# Patient Record
Sex: Female | Born: 2000 | Race: Black or African American | Hispanic: No | Marital: Single | Smoking: Former smoker
Health system: Southern US, Community
[De-identification: ages and names within clinical notes are randomized; demographics above are authoritative.]

## PROBLEM LIST (undated history)

## (undated) DIAGNOSIS — B353 Tinea pedis: Secondary | ICD-10-CM

## (undated) DIAGNOSIS — J45909 Unspecified asthma, uncomplicated: Secondary | ICD-10-CM

## (undated) DIAGNOSIS — I10 Essential (primary) hypertension: Secondary | ICD-10-CM

## (undated) HISTORY — PX: CYST REMOVAL HAND: SHX6279

## (undated) HISTORY — PX: TONSILLECTOMY AND ADENOIDECTOMY: SUR1326

## (undated) HISTORY — DX: Unspecified asthma, uncomplicated: J45.909

---
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
## 2021-02-07 ENCOUNTER — Other Ambulatory Visit: Payer: Self-pay

## 2021-02-07 ENCOUNTER — Emergency Department
Admission: EM | Admit: 2021-02-07 | Discharge: 2021-02-07 | Disposition: A | Discharge status: Home / Self Care | Payer: Medicaid Other | Attending: Student in an Organized Health Care Education/Training Program | Admitting: Student in an Organized Health Care Education/Training Program

## 2021-02-07 ENCOUNTER — Encounter: Payer: Self-pay | Admitting: Emergency Medicine

## 2021-02-07 DIAGNOSIS — Z20822 Contact with and (suspected) exposure to covid-19: Secondary | ICD-10-CM | POA: Diagnosis not present

## 2021-02-07 DIAGNOSIS — R197 Diarrhea, unspecified: Secondary | ICD-10-CM | POA: Insufficient documentation

## 2021-02-07 DIAGNOSIS — M791 Myalgia, unspecified site: Secondary | ICD-10-CM | POA: Diagnosis not present

## 2021-02-07 DIAGNOSIS — I10 Essential (primary) hypertension: Secondary | ICD-10-CM | POA: Insufficient documentation

## 2021-02-07 DIAGNOSIS — J45909 Unspecified asthma, uncomplicated: Secondary | ICD-10-CM | POA: Insufficient documentation

## 2021-02-07 DIAGNOSIS — R519 Headache, unspecified: Secondary | ICD-10-CM | POA: Insufficient documentation

## 2021-02-07 DIAGNOSIS — R109 Unspecified abdominal pain: Secondary | ICD-10-CM | POA: Diagnosis not present

## 2021-02-07 NOTE — ED Notes (Signed)
Pt reports symptoms began 2 days ago. Pt reports headache, sore throat, generalized body aches, SOB with activity, diarrhea, some nausea. Pt reports hx of asthma, does report mild increase to symptoms. Has home rescue inhaler and maintenance inhaler. Has used rescue inhaler once today after growing SOB at work.

## 2021-02-07 NOTE — ED Provider Notes (Signed)
[facility] Emergency Department Provider Note  ____________________________________________   Event Date/Time   First MD Initiated Contact with Patient 02/07/21 1645     (approximate)  I have reviewed the triage vital signs and the nursing notes.   HISTORY  Chief Complaint Headache and Generalized Body Aches    HPI Pamela Marshall is a 20 y.o. female presents to the emergency department with URI symptoms for 2 days.   Is complaining of ?fever, chills, denies, cough or congestion, chest pain, shortness of breath , does complain of stomach cramping, headache, body aches and diarrhea.  Positive close contact with Covid19+ patient, patient is not vaccinated.   Past Medical History:  Diagnosis Date  . Asthma   . Hypertension     There are no problems to display for this patient.   Past Surgical History:  Procedure Laterality Date  . CYST REMOVAL HAND    . TONSILLECTOMY AND ADENOIDECTOMY      Prior to Admission medications   Medication Sig Start Date End Date Taking? Authorizing Provider  diclofenac sodium (VOLTAREN) 1 % GEL Apply 2 g topically 4 (four) times daily. Patient not taking: Reported on 08/12/2018 02/23/17   Enid Derry, PA-C    Allergies Parke Simmers allergy]  Family History  Problem Relation Age of Onset  . Diabetes Paternal Grandfather   . Diabetes Paternal Grandmother   . Diabetes Paternal Uncle     Social History Social History   Tobacco Use  . Smoking status: Never Smoker  . Smokeless tobacco: Never Used  Vaping Use  . Vaping Use: Never used  Substance Use Topics  . Alcohol use: Never  . Drug use: Never    Review of Systems  Constitutional: Positive fever/chills Eyes: No visual changes. ENT: Negative sore throat. Respiratory: Negative cough Cardiovascular: Negative chest pain Gastrointestinal: Complains of abdominal pain with diarrhea only Genitourinary: Negative for dysuria. Musculoskeletal: Negative  for back pain. Skin: Negative for rash. Neurological: Denies neurological changes    ____________________________________________   PHYSICAL EXAM:  VITAL SIGNS: ED Triage Vitals  Enc Vitals Group     BP 02/07/21 1603 121/84     Pulse Rate 02/07/21 1603 87     Resp 02/07/21 1603 16     Temp 02/07/21 1603 98.2 F (36.8 C)     Temp Source 02/07/21 1603 Oral     SpO2 02/07/21 1603 100 %     Weight 02/07/21 1604 245 lb (111.1 kg)     Height 02/07/21 1604 5\' 2"  (1.575 m)     Head Circumference --      Peak Flow --      Pain Score 02/07/21 1604 10     Pain Loc --      Pain Edu? --      Excl. in GC? --     Constitutional: Alert and oriented. Well appearing and in no acute distress. Eyes: Conjunctivae are normal.  Head: Atraumatic. Nose: No congestion/rhinnorhea. Mouth/Throat: Mucous membranes are moist.  Neck:  supple no lymphadenopathy noted Cardiovascular: Normal rate, regular rhythm. Heart sounds are normal Respiratory: Normal respiratory effort.  No retractions, lungs CTA Abdomen: Abdomen is nontender, bowel sounds normal all 4 quads GU: deferred Musculoskeletal: FROM all extremities, warm and well perfused Neurologic:  Normal speech and language.  Skin:  Skin is warm, dry and intact. No rash noted. Psychiatric: Mood and affect are normal. Speech and behavior are normal.  ____________________________________________   LABS (all labs ordered are listed, but only  abnormal results are displayed)  Labs Reviewed  SARS CORONAVIRUS 2 (TAT 6-24 HRS)   ____________________________________________   ____________________________________________  RADIOLOGY    ____________________________________________   PROCEDURES  Procedure(s) performed: No  Procedures    ____________________________________________   INITIAL IMPRESSION / ASSESSMENT AND PLAN / ED COURSE  Pertinent labs & imaging results that were available during my care of the patient were reviewed  by me and considered in my medical decision making (see chart for details).   Patient is a 20 year old female who complains of Covid-like symptoms.  Exam is unremarkable Pending test for covid   The patient was instructed to quarantine themselves at home.  Follow-up with your regular doctor if any concerns.  Return emergency department for worsening. OTC measures discussed     Pamela Marshall was evaluated in Emergency Department on 02/07/2021 for the symptoms described in the history of present illness. She was evaluated in the context of the global COVID-19 pandemic, which necessitated consideration that the patient might be at risk for infection with the SARS-CoV-2 virus that causes COVID-19. Institutional protocols and algorithms that pertain to the evaluation of patients at risk for COVID-19 are in a state of rapid change based on information released by regulatory bodies including the CDC and federal and state organizations. These policies and algorithms were followed during the patient's care in the ED.   As part of my medical decision making, I reviewed the following data within the electronic MEDICAL RECORD NUMBER Nursing notes reviewed and incorporated, Old chart reviewed, Notes from prior ED visits and [place] Controlled Substance Database  ____________________________________________   FINAL CLINICAL IMPRESSION(S) / ED DIAGNOSES  Final diagnoses:  Suspected COVID-19 virus infection      NEW MEDICATIONS STARTED DURING THIS VISIT:  Current Discharge Medication List       Note:  This document was prepared using Dragon voice recognition software and may include unintentional dictation errors.    Faythe Ghee, PA-C 02/07/21 1713    Merwyn Katos, MD 02/07/21 2011

## 2021-02-07 NOTE — ED Triage Notes (Signed)
Pt in via POV, reports "Covid symptoms" x 2 days, being exposed recently at work.  Complaints of headache, body aches, diarrhea.  Vitals WDL.  Ambulatory to triage, NAD noted at this time.

## 2021-02-07 NOTE — Discharge Instructions (Addendum)
Your Covid test should result in 6 to 24 hours.  Please sign up for [facility] My[facility] with the access code on your discharge papers.  You will see the test result as soon as it is finished.  You may not get a phone call due to the volume of Covid test.  Please sign up for this so you will know your results  Take over-the-counter vitamin C, vitamin D, and zinc to help boost your immune system Take over-the-counter Mucinex for cough as needed Take over-the-counter Tylenol and ibuprofen for fever and body aches Follow-up with your regular doctor as needed Return emergency department worsening Quarantine for 5 days if you are vaccinated and 7-10 days if you are not.  Pamela Marshall is from onset of symptoms.  At that time you may return to school/work if your symptoms have improved and you have been fever free for 24 hours.

## 2021-02-08 LAB — SARS CORONAVIRUS 2 (TAT 6-24 HRS): SARS Coronavirus 2: NEGATIVE

## 2021-04-15 ENCOUNTER — Other Ambulatory Visit: Payer: Self-pay

## 2021-04-15 ENCOUNTER — Ambulatory Visit (LOCAL_COMMUNITY_HEALTH_CENTER): Payer: Medicaid Other

## 2021-04-15 DIAGNOSIS — Z111 Encounter for screening for respiratory tuberculosis: Secondary | ICD-10-CM

## 2021-04-18 ENCOUNTER — Ambulatory Visit (LOCAL_COMMUNITY_HEALTH_CENTER): Payer: Medicaid Other

## 2021-04-18 ENCOUNTER — Other Ambulatory Visit: Payer: Self-pay

## 2021-04-18 DIAGNOSIS — Z111 Encounter for screening for respiratory tuberculosis: Secondary | ICD-10-CM

## 2021-04-18 LAB — TB SKIN TEST
Induration: 0 mm
TB Skin Test: NEGATIVE

## 2021-05-08 ENCOUNTER — Ambulatory Visit: Payer: Medicaid Other | Admitting: Obstetrics and Gynecology

## 2021-05-15 ENCOUNTER — Ambulatory Visit: Payer: Medicaid Other | Admitting: Obstetrics and Gynecology

## 2021-05-22 ENCOUNTER — Telehealth: Payer: Self-pay | Admitting: Family Medicine

## 2021-05-22 NOTE — Telephone Encounter (Signed)
Phone call to pt. Pt states someone had already called her back and she has already scheduled another appt. No further assistance is needed at this time.

## 2021-05-22 NOTE — Telephone Encounter (Signed)
Patient needed to have a 2nd Step TB Skin Test, but her 1st one was completed on 4/21. She is calling to see how to proceed. Does she need to make it all over again, or would she be still on time for that 2nd Step? Please call her back. Thanks

## 2021-05-28 ENCOUNTER — Other Ambulatory Visit: Payer: Self-pay

## 2021-05-31 ENCOUNTER — Ambulatory Visit (LOCAL_COMMUNITY_HEALTH_CENTER): Payer: Medicaid Other

## 2021-05-31 ENCOUNTER — Other Ambulatory Visit: Payer: Self-pay

## 2021-05-31 DIAGNOSIS — Z111 Encounter for screening for respiratory tuberculosis: Secondary | ICD-10-CM

## 2021-06-03 ENCOUNTER — Ambulatory Visit (LOCAL_COMMUNITY_HEALTH_CENTER): Payer: Medicaid Other

## 2021-06-03 ENCOUNTER — Other Ambulatory Visit: Payer: Self-pay

## 2021-06-03 DIAGNOSIS — Z111 Encounter for screening for respiratory tuberculosis: Secondary | ICD-10-CM

## 2021-06-03 LAB — TB SKIN TEST
Induration: 0 mm
TB Skin Test: NEGATIVE

## 2021-06-10 ENCOUNTER — Encounter: Payer: Self-pay | Admitting: Obstetrics and Gynecology

## 2021-06-10 ENCOUNTER — Ambulatory Visit (INDEPENDENT_AMBULATORY_CARE_PROVIDER_SITE_OTHER): Payer: Medicaid Other | Admitting: Obstetrics and Gynecology

## 2021-06-10 ENCOUNTER — Other Ambulatory Visit: Payer: Self-pay

## 2021-06-10 ENCOUNTER — Other Ambulatory Visit (HOSPITAL_COMMUNITY)
Admission: RE | Admit: 2021-06-10 | Discharge: 2021-06-10 | Disposition: A | Discharge status: Home / Self Care | Payer: Medicaid Other | Source: Ambulatory Visit | Attending: Obstetrics and Gynecology | Admitting: Obstetrics and Gynecology

## 2021-06-10 VITALS — BP 100/80 | Ht 62.0 in | Wt 240.0 lb

## 2021-06-10 DIAGNOSIS — Z113 Encounter for screening for infections with a predominantly sexual mode of transmission: Secondary | ICD-10-CM | POA: Diagnosis present

## 2021-06-10 DIAGNOSIS — Z3046 Encounter for surveillance of implantable subdermal contraceptive: Secondary | ICD-10-CM | POA: Diagnosis not present

## 2021-06-10 MED ORDER — NEXPLANON 68 MG ~~LOC~~ IMPL: 1.0000 | DRUG_IMPLANT | Freq: Once | SUBCUTANEOUS | 0 refills | Status: DC

## 2021-06-10 NOTE — Progress Notes (Signed)
Obstetrics & Gynecology Office Visit   Chief Complaint  Patient presents with   Contraception    Nexplanon removal, would like another nexplanon   STD testing   Menorrhagia    For the last 3 months, severe cramping    History of Present Illness: 20 y.o. G0P0000 female who presents for removal of her Nexplanon.  She has been having frequent periods. This has been going on for 3 months. This is getting to the point where she has cramps and stomach pains. Nothing else has changed.  She had her current Nexplanon since 07/2018.  She would like STI screening and replacement of her Nexplanon to see if that helps with the bleeding.     Past Medical History:  Diagnosis Date   Asthma    Hypertension     Past Surgical History:  Procedure Laterality Date   CYST REMOVAL HAND     TONSILLECTOMY AND ADENOIDECTOMY      Gynecologic History: Patient's last menstrual period was 06/07/2021 (exact date).  Obstetric History: G0P0000  Family History  Problem Relation Age of Onset   Diabetes Paternal Grandfather    Diabetes Paternal Grandmother    Diabetes Paternal Uncle     Social History   Socioeconomic History   Marital status: Single    Spouse name: Not on file   Number of children: Not on file   Years of education: Not on file   Highest education level: Not on file  Occupational History   Not on file  Tobacco Use   Smoking status: Never   Smokeless tobacco: Never  Vaping Use   Vaping Use: Never used  Substance and Sexual Activity   Alcohol use: Never   Drug use: Never   Sexual activity: Yes    Birth control/protection: Implant  Other Topics Concern   Not on file  Social History Narrative   Not on file   Social Determinants of Health   Financial Resource Strain: Not on file  Food Insecurity: Not on file  Transportation Needs: Not on file  Physical Activity: Not on file  Stress: Not on file  Social Connections: Not on file  Intimate Partner Violence: Not on file     Allergies  Allergen Reactions   Crab [Shellfish Allergy] Other (See Comments)    Throat soreness     Prior to Admission medications   Albuterol inhaler prn asthma.     Review of Systems  Constitutional: Negative.   HENT: Negative.    Eyes: Negative.   Respiratory: Negative.    Cardiovascular: Negative.   Gastrointestinal: Negative.   Genitourinary: Negative.   Musculoskeletal: Negative.   Skin: Negative.   Neurological: Negative.   Psychiatric/Behavioral: Negative.      Physical Exam BP 100/80   Ht 5\' 2"  (1.575 m)   Wt 240 lb (108.9 kg)   LMP 06/07/2021 (Exact Date)   BMI 43.90 kg/m  Patient's last menstrual period was 06/07/2021 (exact date). Physical Exam Constitutional:      General: She is not in acute distress.    Appearance: Normal appearance.  Genitourinary:     Bladder and urethral meatus normal.     No lesions in the vagina.     Right Labia: No rash, tenderness, lesions or skin changes.    Left Labia: No tenderness, lesions, skin changes or rash.    No inguinal adenopathy present in the right or left side.    Pelvic Tanner Score: 5/5.    No vaginal discharge, erythema, bleeding or  ulceration.     No vaginal prolapse present.     Right Adnexa: not tender, not full and no mass present.    Left Adnexa: not tender, not full and no mass present.    No cervical motion tenderness, discharge, friability, lesion or polyp.     Uterus is not enlarged, fixed or tender.     Uterus is anteverted.  HENT:     Head: Normocephalic and atraumatic.  Eyes:     General: No scleral icterus.    Conjunctiva/sclera: Conjunctivae normal.  Lymphadenopathy:     Lower Body: No right inguinal adenopathy. No left inguinal adenopathy.  Neurological:     General: No focal deficit present.     Mental Status: She is alert and oriented to person, place, and time.     Cranial Nerves: No cranial nerve deficit.  Psychiatric:        Mood and Affect: Mood normal.        Behavior:  Behavior normal.        Judgment: Judgment normal.     GYNECOLOGY PROCEDURE NOTE  Nexplanon removal discussed in detail.  Risks of infection, bleeding, nerve injury all reviewed.  Patient understands risks and desires to proceed.  Verbal consent obtained.  Patient is certain she wants the Nexplanon removed.  All questions answered.  Procedure: Patient placed in dorsal supine with left arm above head, elbow flexed at 90 degrees, arm resting on examination table.  Nexplanon identified without problems.  Betadine scrub x3.  1 ml of 1% lidocaine injected under Nexplanondevice without problems.  Sterile gloves applied.  Small 0.5cm incision made at distal tip of Nexplanon device with 11 blade scalpel.  Nexplanon brought to incision and grasped with a small kelly clamp.  Nexplanon removed intact without problems.  Pressure applied to incision.  Hemostasis obtained.     GYNECOLOGY PROCEDURE NOTE  Patient is a 20 y.o. G0P0000 presenting for Nexplanon insertion as her desires means of contraception.  She provided informed consent, signed copy in the chart, time out was performed. Pregnancy test was not done given her use of Nexplanon that is still in date, with self reported LMP of Patient's last menstrual period was 06/07/2021 (exact date).  She understands that Nexplanon is a progesterone only therapy, and that patients often patients have irregular and unpredictable vaginal bleeding or amenorrhea. She understands that other side effects are possible related to systemic progesterone, including but not limited to, headaches, breast tenderness, nausea, and irritability. While effective at preventing pregnancy long acting reversible contraceptives do not prevent transmission of sexually transmitted diseases and use of barrier methods for this purpose was discussed. The placement procedure for Nexplanon was reviewed with the patient in detail including risks of nerve injury, infection, bleeding and injury to  other muscles or tendons. She understands that the Nexplanon implant is good for 3 years and needs to be removed at the end of that time.  She understands that Nexplanon is an extremely effective option for contraception, with failure rate of <1%. This information is reviewed today and all questions were answered. Informed consent was obtained, both verbally and written.   The patient is healthy and has no contraindications to Nexplanon use. Urine pregnancy test was performed today and was negative.  Procedure Appropriate time out taken.  Patient placed in dorsal supine with left arm above head, elbow flexed at 90 degrees, arm resting on examination table with hand behind her head.  The bicipital grove was palpated and site 8-10cm  proximal to the medial epicondyle was indentified.  Per the manufacturer's recommendations, the insertion site was marked along a line 3-5 cm posterior (toward the triceps) to the bicipital groove and at 8-10 cm medial to the medial epicondyle. The insertion site was prepped with a two betadine swabs and then injected with 3 cc of 1% lidocaine without epinephrine.  Nexplanon removed form sterile blister packaging,  Device confirmed in needle, before inserting full length of needle, tenting up the skin as the needle was advance.  The drug eluting rod was then deployed by pulling back the slider per the manufactures recommendation.  The implant was palpable by the clinician as well as the patient.  The insertion site covered dressed with a 1/2" steri-strip before applying  a kerlex bandage pressure dressing..Minimal blood loss was noted during the procedure.  The patient tolerated the procedure well.   She was instructed to wear the bandage for 24 hours, call with any signs of infection.  She was given the Nexplanon card and instructed to have the rod removed in 3 years.  Female chaperone present for pelvic and breast  portions of the physical exam  Assessment: 20 y.o. G0P0000  female here for  1. Encounter for removal and reinsertion of Nexplanon   2. Screen for STD (sexually transmitted disease)      Plan: Problem List Items Addressed This Visit   None Visit Diagnoses     Encounter for removal and reinsertion of Nexplanon    -  Primary   Relevant Medications   etonogestrel (NEXPLANON) 68 MG IMPL implant   Screen for STD (sexually transmitted disease)       Relevant Orders   Cervicovaginal ancillary only      Successful Nexplanon removal and reinsertion.   STI screening obtained. If no relief of sx in 2 months, return to clinic for further assessment.  Thomasene Mohair, MD 06/10/2021 11:07 AM

## 2021-06-11 LAB — CERVICOVAGINAL ANCILLARY ONLY
Chlamydia: POSITIVE — AB
Comment: NEGATIVE
Comment: NEGATIVE
Comment: NORMAL
Neisseria Gonorrhea: POSITIVE — AB
Trichomonas: NEGATIVE

## 2021-06-11 NOTE — Telephone Encounter (Signed)
Pt called as well about positive results.  [phone]

## 2021-06-12 ENCOUNTER — Other Ambulatory Visit: Payer: Self-pay | Admitting: Obstetrics and Gynecology

## 2021-06-12 DIAGNOSIS — A749 Chlamydial infection, unspecified: Secondary | ICD-10-CM

## 2021-06-12 DIAGNOSIS — A549 Gonococcal infection, unspecified: Secondary | ICD-10-CM

## 2021-06-12 MED ORDER — DOXYCYCLINE HYCLATE 100 MG PO CAPS: 100.0000 mg | ORAL_CAPSULE | Freq: Two times a day (BID) | ORAL | 0 refills | Status: AC

## 2021-06-12 MED ORDER — CEFTRIAXONE SODIUM 500 MG IJ SOLR: 500.0000 mg | Freq: Once | INTRAMUSCULAR | Status: DC

## 2021-06-13 ENCOUNTER — Other Ambulatory Visit: Payer: Self-pay

## 2021-06-13 ENCOUNTER — Ambulatory Visit (INDEPENDENT_AMBULATORY_CARE_PROVIDER_SITE_OTHER): Payer: Medicaid Other

## 2021-06-13 DIAGNOSIS — A549 Gonococcal infection, unspecified: Secondary | ICD-10-CM | POA: Diagnosis not present

## 2021-06-13 MED ORDER — CEFTRIAXONE SODIUM 500 MG IJ SOLR
500.0000 mg | Freq: Once | INTRAMUSCULAR | Status: AC
  Administered 2021-06-13: 500 mg via INTRAMUSCULAR

## 2021-06-13 NOTE — Progress Notes (Signed)
Patient presents today for Ceftriaxone injection. Given IM LUOQ. Patient tolerated well.

## 2021-09-09 ENCOUNTER — Other Ambulatory Visit: Payer: Self-pay

## 2021-09-09 ENCOUNTER — Ambulatory Visit: Payer: Medicaid Other | Admitting: Obstetrics and Gynecology

## 2021-09-09 ENCOUNTER — Encounter: Payer: Self-pay | Admitting: Obstetrics and Gynecology

## 2021-09-09 ENCOUNTER — Ambulatory Visit (INDEPENDENT_AMBULATORY_CARE_PROVIDER_SITE_OTHER): Payer: Medicaid Other | Admitting: Obstetrics and Gynecology

## 2021-09-09 ENCOUNTER — Other Ambulatory Visit (HOSPITAL_COMMUNITY)
Admission: RE | Admit: 2021-09-09 | Discharge: 2021-09-09 | Disposition: A | Discharge status: Home / Self Care | Payer: Medicaid Other | Source: Ambulatory Visit | Attending: Obstetrics and Gynecology | Admitting: Obstetrics and Gynecology

## 2021-09-09 VITALS — BP 100/70 | Ht 62.0 in | Wt 244.0 lb

## 2021-09-09 DIAGNOSIS — Z113 Encounter for screening for infections with a predominantly sexual mode of transmission: Secondary | ICD-10-CM

## 2021-09-09 DIAGNOSIS — N926 Irregular menstruation, unspecified: Secondary | ICD-10-CM | POA: Diagnosis not present

## 2021-09-09 DIAGNOSIS — A749 Chlamydial infection, unspecified: Secondary | ICD-10-CM

## 2021-09-09 DIAGNOSIS — A549 Gonococcal infection, unspecified: Secondary | ICD-10-CM | POA: Diagnosis present

## 2021-09-09 DIAGNOSIS — Z3046 Encounter for surveillance of implantable subdermal contraceptive: Secondary | ICD-10-CM

## 2021-09-09 NOTE — Progress Notes (Signed)
[facility]   Chief Complaint  Patient presents with   STD testing   Follow-up    Tingling sensation/pain in nexplanon location since insertion    HPI:      [name] is a 20 y.o. G0P0000 whose LMP was No LMP recorded. Patient has had an implant., presents today for STD test of cure. Pos gon and chlamydia on culture 06/10/21; treated with rocephin and doxy BID for 7 days. Partner did tx and they were sexually active afterwards, but he was still talking to another female. Pt unsure if she still has it or not. No vag sx, no pelvic pain. Not sex active now.  Nexplanon replaced by Dr. Jean Marshall 06/10/21. Has had daily bleeding with clots the past 2 months, irreg bleeding prior to and after replacement. Area of nexplanon rod with some tingling and numbness in upper arm, occas pain. No evid of infection.    Past Medical History:  Diagnosis Date   Asthma    Hypertension     Past Surgical History:  Procedure Laterality Date   CYST REMOVAL HAND     TONSILLECTOMY AND ADENOIDECTOMY      Family History  Problem Relation Age of Onset   Diabetes Paternal Grandfather    Diabetes Paternal Grandmother    Diabetes Paternal Uncle     Social History   Socioeconomic History   Marital status: Single    Spouse name: Not on file   Number of children: Not on file   Years of education: Not on file   Highest education level: Not on file  Occupational History   Not on file  Tobacco Use   Smoking status: Never   Smokeless tobacco: Never  Vaping Use   Vaping Use: Never used  Substance and Sexual Activity   Alcohol use: Never   Drug use: Never   Sexual activity: Yes    Birth control/protection: Implant  Other Topics Concern   Not on file  Social History Narrative   Not on file   Social Determinants of Health   Financial Resource Strain: Not on file  Food Insecurity: Not on file  Transportation Needs: Not on file  Physical Activity: Not on file  Stress:  Not on file  Social Connections: Not on file  Intimate Partner Violence: Not on file    Outpatient Medications Prior to Visit  Medication Sig Dispense Refill   etonogestrel (NEXPLANON) 68 MG IMPL implant 1 each (68 mg total) by Subdermal route once for 1 dose. 1 each 0   Facility-Administered Medications Prior to Visit  Medication Dose Route Frequency Provider Last Rate Last Admin   cefTRIAXone (ROCEPHIN) injection 500 mg  500 mg Intramuscular Once Pamela Novak, MD       triamcinolone acetonide (KENALOG) 10 MG/ML injection 10 mg  10 mg Other Once Pamela Marshall, DPM          ROS:  Review of Systems  Constitutional:  Negative for fever.  Gastrointestinal:  Negative for blood in stool, constipation, diarrhea, nausea and vomiting.  Genitourinary:  Positive for vaginal bleeding. Negative for dyspareunia, dysuria, flank pain, frequency, hematuria, urgency, vaginal discharge and vaginal pain.  Musculoskeletal:  Negative for back pain.  Skin:  Negative for rash.  BREAST: No symptoms   OBJECTIVE:   Vitals:  BP 100/70   Ht 5\' 2"  (1.575 m)   Wt 244 lb (110.7 kg)   BMI 44.63 kg/m   Physical Exam Vitals reviewed.  Constitutional:  Appearance: She is well-developed.  Pulmonary:     Effort: Pulmonary effort is normal.  Genitourinary:    General: Normal vulva.     Pubic Area: No rash.      Labia:        Right: No rash, tenderness or lesion.        Left: No rash, tenderness or lesion.      Vagina: Normal. No vaginal discharge, erythema or tenderness.     Cervix: Normal.     Uterus: Normal. Not enlarged and not tender.      Adnexa: Right adnexa normal and left adnexa normal.       Right: No mass or tenderness.         Left: No mass or tenderness.    Musculoskeletal:        General: Normal range of motion.     Cervical back: Normal range of motion.  Skin:    General: Skin is warm and dry.     Findings: No abscess, bruising or signs of injury.     Comments: LT ARM  WITH NEXPLANON; NEG EXAM  Neurological:     General: No focal deficit present.     Mental Status: She is alert and oriented to person, place, and time.  Psychiatric:        Mood and Affect: Mood normal.        Behavior: Behavior normal.        Thought Content: Thought content normal.        Judgment: Judgment normal.    Assessment/Plan: Gonorrhea - Plan: Cervicovaginal ancillary only  Chlamydia - Plan: Cervicovaginal ancillary only  Screening for STD (sexually transmitted disease) - Plan: Cervicovaginal ancillary only; test of cure today. Will f/u with results.   Irregular bleeding--with nexplanon. Rule out STDs. If neg, will do POPs to stop bleeding.   Encounter for surveillance of implantable subdermal contraceptive--rod in correct location; may be on nerve causing sx, but not worrisome. Reassurance. Can always remove and replace prn.     Return if symptoms worsen or fail to improve.  Pamela Marshall B. Pamela Rahn, PA-C 09/09/2021 4:38 PM

## 2021-09-11 ENCOUNTER — Telehealth: Payer: Self-pay | Admitting: Obstetrics and Gynecology

## 2021-09-11 DIAGNOSIS — A749 Chlamydial infection, unspecified: Secondary | ICD-10-CM

## 2021-09-11 DIAGNOSIS — A549 Gonococcal infection, unspecified: Secondary | ICD-10-CM

## 2021-09-11 LAB — CERVICOVAGINAL ANCILLARY ONLY
Chlamydia: POSITIVE — AB
Comment: NEGATIVE
Comment: NEGATIVE
Comment: NORMAL
Neisseria Gonorrhea: POSITIVE — AB
Trichomonas: NEGATIVE

## 2021-09-11 MED ORDER — AZITHROMYCIN 500 MG PO TABS: 1000.0000 mg | ORAL_TABLET | Freq: Once | ORAL | 0 refills | Status: AC

## 2021-09-11 MED ORDER — CEFTRIAXONE SODIUM 500 MG IJ SOLR: 500.0000 mg | Freq: Once | INTRAMUSCULAR | Status: DC

## 2021-09-11 NOTE — Telephone Encounter (Signed)
Pt aware of pos gon/chlam. Rx azithro eRxd, RTO tomorrow with RN for rocephin. RTO in 4 wks for TOC. Partner needs tx, no sex activity for 1 wk after both treated. Use condoms. CMA to notify [facility].  Pt with BTB on nexplanon. Treated STDs first. Will do POPs if sx persist after neg TOC.

## 2021-09-12 ENCOUNTER — Ambulatory Visit: Payer: Medicaid Other

## 2021-09-13 ENCOUNTER — Ambulatory Visit (INDEPENDENT_AMBULATORY_CARE_PROVIDER_SITE_OTHER): Payer: Medicaid Other

## 2021-09-13 ENCOUNTER — Other Ambulatory Visit: Payer: Self-pay

## 2021-09-13 DIAGNOSIS — A749 Chlamydial infection, unspecified: Secondary | ICD-10-CM | POA: Diagnosis not present

## 2021-09-13 DIAGNOSIS — A549 Gonococcal infection, unspecified: Secondary | ICD-10-CM | POA: Diagnosis not present

## 2021-09-13 MED ORDER — LIDOCAINE HCL (PF) 1 % IJ SOLN
0.9000 mL | Freq: Once | INTRAMUSCULAR | Status: AC
  Administered 2021-09-13: 0.9 mL

## 2021-09-13 MED ORDER — CEFTRIAXONE SODIUM 500 MG IJ SOLR
500.0000 mg | Freq: Once | INTRAMUSCULAR | Status: AC
  Administered 2021-09-13: 500 mg via INTRAMUSCULAR

## 2021-09-13 NOTE — Progress Notes (Addendum)
Pt here for inj of ceftriaxone 500mg  which was given IM right dorsoglut after reconstituting with 1% lidocaine 0.72ml.  NDC# [phone]  Pt tolerated inj well.

## 2021-09-19 NOTE — Telephone Encounter (Signed)
[facility] notified. 

## 2021-12-16 NOTE — Progress Notes (Signed)
[facility]   Chief Complaint  Patient presents with   STD testing    HPI:      [name] Marshall a 20 y.o. G0P0000 whose LMP was Patient's last menstrual period was 11/11/2021 (approximate)., presents today for test of cure for Gon/chlam 9/22; treated with azithro and rocephin. Pt states partner said he did tx but Pamela doesn't think he did. No longer with him now but afraid Pamela still has STDs. Had Pos gon and chlamydia on culture 06/10/21; treated with rocephin and doxy BID for 7 days without neg TOC 9/22. Pt now with increased vag d/c with odor, mild itching for a couple wk. No pelvic pain/LBP, fevers.  Not sex active currently, wants to get neg STD results first.   Patient Active Problem List   Diagnosis Date Noted   Gonorrhea 09/09/2021   Chlamydia 09/09/2021    Past Surgical History:  Procedure Laterality Date   CYST REMOVAL HAND     TONSILLECTOMY AND ADENOIDECTOMY      Family History  Problem Relation Age of Onset   Diabetes Paternal Grandfather    Diabetes Paternal Grandmother    Diabetes Paternal Uncle     Social History   Socioeconomic History   Marital status: Single    Spouse name: Not on file   Number of children: Not on file   Years of education: Not on file   Highest education level: Not on file  Occupational History   Not on file  Tobacco Use   Smoking status: Never   Smokeless tobacco: Never  Vaping Use   Vaping Use: Never used  Substance and Sexual Activity   Alcohol use: Never   Drug use: Never   Sexual activity: Not Currently    Birth control/protection: Implant  Other Topics Concern   Not on file  Social History Narrative   Not on file   Social Determinants of Health   Financial Resource Strain: Not on file  Food Insecurity: Not on file  Transportation Needs: Not on file  Physical Activity: Not on file  Stress: Not on file  Social Connections: Not on file  Intimate Partner Violence: Not on file     Outpatient Medications Prior to Visit  Medication Sig Dispense Refill   etonogestrel (NEXPLANON) 68 MG IMPL implant 1 each (68 mg total) by Subdermal route once for 1 dose. 1 each 0   Facility-Administered Medications Prior to Visit  Medication Dose Route Frequency Provider Last Rate Last Admin   cefTRIAXone (ROCEPHIN) injection 500 mg  500 mg Intramuscular Once Conard Novak, MD       cefTRIAXone (ROCEPHIN) injection 500 mg  500 mg Intramuscular Once Consuela Widener B, PA-C       triamcinolone acetonide (KENALOG) 10 MG/ML injection 10 mg  10 mg Other Once Felecia Shelling, DPM          ROS:  Review of Systems  Constitutional:  Negative for fever.  Gastrointestinal:  Negative for blood in stool, constipation, diarrhea, nausea and vomiting.  Genitourinary:  Positive for vaginal discharge. Negative for dyspareunia, dysuria, flank pain, frequency, hematuria, urgency, vaginal bleeding and vaginal pain.  Musculoskeletal:  Negative for back pain.  Skin:  Negative for rash.  BREAST: No symptoms   OBJECTIVE:   Vitals:  BP 100/70    Ht 5\' 2"  (1.575 m)    Wt 237 lb (107.5 kg)    LMP 11/11/2021 (Approximate)    BMI 43.35 kg/m  Physical Exam Vitals reviewed.  Constitutional:      Appearance: Pamela Marshall well-developed.  Pulmonary:     Effort: Pulmonary effort Marshall normal.  Genitourinary:    General: Normal vulva.     Pubic Area: No rash.      Labia:        Right: No rash, tenderness or lesion.        Left: No rash, tenderness or lesion.      Vagina: Vaginal discharge present. No erythema or tenderness.     Cervix: Normal.     Uterus: Normal. Not enlarged and not tender.      Adnexa: Right adnexa normal and left adnexa normal.       Right: No mass or tenderness.         Left: No mass or tenderness.    Musculoskeletal:        General: Normal range of motion.     Cervical back: Normal range of motion.  Skin:    General: Skin Marshall warm and dry.  Neurological:     General: No  focal deficit present.     Mental Status: Pamela Marshall alert and oriented to person, place, and time.  Psychiatric:        Mood and Affect: Mood normal.        Behavior: Behavior normal.        Thought Content: Thought content normal.        Judgment: Judgment normal.    Results: Results for orders placed or performed in visit on 12/17/21 (from the past 24 hour(s))  POCT Wet Prep with KOH     Status: Normal   Collection Time: 12/17/21  9:05 AM  Result Value Ref Range   Trichomonas, UA Negative    Clue Cells Wet Prep HPF POC neg    Epithelial Wet Prep HPF POC     Yeast Wet Prep HPF POC neg    Bacteria Wet Prep HPF POC     RBC Wet Prep HPF POC     WBC Wet Prep HPF POC     KOH Prep POC Negative Negative     Assessment/Plan: Gonorrhea - Plan: Cervicovaginal ancillary only,   Chlamydia - Plan: Cervicovaginal ancillary only,   Screening for STD (sexually transmitted disease) - Plan: Cervicovaginal ancillary only, test of cure today. Will f/u if pos.   Vaginal discharge - Plan: Cervicovaginal ancillary only, POCT Wet Prep with KOH; neg wet prep. Chck STDs and BV on culture. Will f/u if pos and treat.     Return if symptoms worsen or fail to improve.  Franceska Strahm B. Shali Vesey, PA-C 12/17/2021 9:06 AM

## 2021-12-17 ENCOUNTER — Other Ambulatory Visit (HOSPITAL_COMMUNITY)
Admission: RE | Admit: 2021-12-17 | Discharge: 2021-12-17 | Disposition: A | Discharge status: Home / Self Care | Payer: Medicaid Other | Source: Ambulatory Visit | Attending: Obstetrics and Gynecology | Admitting: Obstetrics and Gynecology

## 2021-12-17 ENCOUNTER — Other Ambulatory Visit: Payer: Self-pay

## 2021-12-17 ENCOUNTER — Ambulatory Visit (INDEPENDENT_AMBULATORY_CARE_PROVIDER_SITE_OTHER): Payer: Medicaid Other | Admitting: Obstetrics and Gynecology

## 2021-12-17 ENCOUNTER — Encounter: Payer: Self-pay | Admitting: Obstetrics and Gynecology

## 2021-12-17 VITALS — BP 100/70 | Ht 62.0 in | Wt 237.0 lb

## 2021-12-17 DIAGNOSIS — Z113 Encounter for screening for infections with a predominantly sexual mode of transmission: Secondary | ICD-10-CM | POA: Diagnosis present

## 2021-12-17 DIAGNOSIS — A749 Chlamydial infection, unspecified: Secondary | ICD-10-CM | POA: Diagnosis present

## 2021-12-17 DIAGNOSIS — N898 Other specified noninflammatory disorders of vagina: Secondary | ICD-10-CM

## 2021-12-17 DIAGNOSIS — A549 Gonococcal infection, unspecified: Secondary | ICD-10-CM | POA: Diagnosis not present

## 2021-12-17 LAB — POCT WET PREP WITH KOH
Clue Cells Wet Prep HPF POC: NEGATIVE
KOH Prep POC: NEGATIVE
Trichomonas, UA: NEGATIVE
Yeast Wet Prep HPF POC: NEGATIVE

## 2021-12-17 NOTE — Patient Instructions (Signed)
I value your feedback and you entrusting us with your care. If you get a [facility] patient survey, I would appreciate you taking the time to let us know about your experience today. Thank you! ? ? ?

## 2021-12-18 ENCOUNTER — Telehealth: Payer: Self-pay | Admitting: Obstetrics and Gynecology

## 2021-12-18 ENCOUNTER — Ambulatory Visit (INDEPENDENT_AMBULATORY_CARE_PROVIDER_SITE_OTHER): Payer: Medicaid Other

## 2021-12-18 DIAGNOSIS — A749 Chlamydial infection, unspecified: Secondary | ICD-10-CM

## 2021-12-18 DIAGNOSIS — N760 Acute vaginitis: Secondary | ICD-10-CM

## 2021-12-18 DIAGNOSIS — A549 Gonococcal infection, unspecified: Secondary | ICD-10-CM | POA: Diagnosis not present

## 2021-12-18 LAB — CERVICOVAGINAL ANCILLARY ONLY
Bacterial Vaginitis (gardnerella): POSITIVE — AB
Chlamydia: POSITIVE — AB
Comment: NEGATIVE
Comment: NEGATIVE
Comment: NEGATIVE
Comment: NORMAL
Neisseria Gonorrhea: POSITIVE — AB
Trichomonas: NEGATIVE

## 2021-12-18 MED ORDER — METRONIDAZOLE 500 MG PO TABS: ORAL_TABLET | ORAL | 0 refills | Status: DC

## 2021-12-18 MED ORDER — AZITHROMYCIN 500 MG PO TABS: 1000.0000 mg | ORAL_TABLET | Freq: Once | ORAL | 0 refills | Status: AC

## 2021-12-18 MED ORDER — CEFTRIAXONE SODIUM 500 MG IJ SOLR
500.0000 mg | Freq: Once | INTRAMUSCULAR | Status: AC
  Administered 2021-12-18: 16:00:00 500 mg via INTRAMUSCULAR

## 2021-12-18 MED ORDER — CEFTRIAXONE SODIUM 500 MG IJ SOLR: 500.0000 mg | Freq: Once | INTRAMUSCULAR | Status: DC

## 2021-12-18 NOTE — Telephone Encounter (Signed)
Pt aware of pos gon/chlam/BV on culture. Rx azithro and flagyl, will RTO for rocephin today. RTO in 4 wks TOC. Pt not sex active currently. CMA to notify [facility].   Meds ordered this encounter  Medications   azithromycin (ZITHROMAX) 500 MG tablet    Sig: Take 2 tablets (1,000 mg total) by mouth once for 1 dose.    Dispense:  2 tablet    Refill:  0    Order Specific Question:   Supervising Provider    Answer:   Nadara Mustard [[id number]]   metroNIDAZOLE (FLAGYL) 500 MG tablet    Sig: Take 1 tab BID for 7 days; NO alcohol use for 10 days after prescription start    Dispense:  14 tablet    Refill:  0    Order Specific Question:   Supervising Provider    Answer:   Nadara Mustard [[id number]]   cefTRIAXone (ROCEPHIN) injection 500 mg

## 2021-12-18 NOTE — Progress Notes (Signed)
Pt here for rocephin 500mg  inj which was given IM right glut.  Pt tolerated well.  [phone].  It was reconstituted c 1% xylocaine 0.59ml  NDC# 10m  exp date 04/2025  05/2025

## 2021-12-19 NOTE — Telephone Encounter (Signed)
[facility] notified. 

## 2022-01-14 NOTE — Progress Notes (Deleted)
° ° °  [facility]   No chief complaint on file.   HPI:      [name] is a 21 y.o. G0P0000 whose LMP was No LMP recorded. Patient has had an implant., presents today for *** Treated with rocephin 500 mg and azithro 1 g  12/17/21 NOTE:  test of cure for Gon/chlam 9/22; treated with azithro and rocephin. Pt states partner said he did tx but she doesn't think he did. No longer with him now but afraid she still has STDs. Had Pos gon and chlamydia on culture 06/10/21; treated with rocephin and doxy BID for 7 days without neg TOC 9/22. Pt now with increased vag d/c with odor, mild itching for a couple wk. No pelvic pain/LBP, fevers.  Not sex active currently, wants to get neg STD results first.   Patient Active Problem List   Diagnosis Date Noted   Gonorrhea 09/09/2021   Chlamydia 09/09/2021    Past Surgical History:  Procedure Laterality Date   CYST REMOVAL HAND     TONSILLECTOMY AND ADENOIDECTOMY      Family History  Problem Relation Age of Onset   Diabetes Paternal Grandfather    Diabetes Paternal Grandmother    Diabetes Paternal Uncle     Social History   Socioeconomic History   Marital status: Single    Spouse name: Not on file   Number of children: Not on file   Years of education: Not on file   Highest education level: Not on file  Occupational History   Not on file  Tobacco Use   Smoking status: Never   Smokeless tobacco: Never  Vaping Use   Vaping Use: Never used  Substance and Sexual Activity   Alcohol use: Never   Drug use: Never   Sexual activity: Not Currently    Birth control/protection: Implant  Other Topics Concern   Not on file  Social History Narrative   Not on file   Social Determinants of Health   Financial Resource Strain: Not on file  Food Insecurity: Not on file  Transportation Needs: Not on file  Physical Activity: Not on file  Stress: Not on file  Social Connections: Not on file  Intimate Partner Violence:  Not on file    Outpatient Medications Prior to Visit  Medication Sig Dispense Refill   etonogestrel (NEXPLANON) 68 MG IMPL implant 1 each (68 mg total) by Subdermal route once for 1 dose. 1 each 0   metroNIDAZOLE (FLAGYL) 500 MG tablet Take 1 tab BID for 7 days; NO alcohol use for 10 days after prescription start 14 tablet 0   Facility-Administered Medications Prior to Visit  Medication Dose Route Frequency Provider Last Rate Last Admin   cefTRIAXone (ROCEPHIN) injection 500 mg  500 mg Intramuscular Once Loghan Kurtzman B, PA-C       triamcinolone acetonide (KENALOG) 10 MG/ML injection 10 mg  10 mg Other Once Daylene Katayama M, DPM          ROS:  Review of Systems BREAST: No symptoms   OBJECTIVE:   Vitals:  There were no vitals taken for this visit.  Physical Exam  Results: No results found for this or any previous visit (from the past 24 hour(s)).   Assessment/Plan: No diagnosis found.    No orders of the defined types were placed in this encounter.     No follow-ups on file.  Zaineb Nowaczyk B. Chanler Schreiter, PA-C 01/14/2022 4:17 PM

## 2022-01-15 ENCOUNTER — Ambulatory Visit: Payer: Medicaid Other | Admitting: Obstetrics and Gynecology

## 2022-01-15 DIAGNOSIS — A749 Chlamydial infection, unspecified: Secondary | ICD-10-CM

## 2022-01-15 DIAGNOSIS — A549 Gonococcal infection, unspecified: Secondary | ICD-10-CM

## 2022-01-15 DIAGNOSIS — Z113 Encounter for screening for infections with a predominantly sexual mode of transmission: Secondary | ICD-10-CM
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
## 2022-06-10 ENCOUNTER — Other Ambulatory Visit: Payer: Self-pay

## 2022-06-10 ENCOUNTER — Ambulatory Visit (HOSPITAL_COMMUNITY)
Admission: EM | Admit: 2022-06-10 | Discharge: 2022-06-10 | Disposition: A | Discharge status: Home / Self Care | Payer: Medicaid Other | Attending: Student | Admitting: Student

## 2022-06-10 ENCOUNTER — Encounter (HOSPITAL_COMMUNITY): Payer: Self-pay | Admitting: Emergency Medicine

## 2022-06-10 DIAGNOSIS — B353 Tinea pedis: Secondary | ICD-10-CM

## 2022-06-10 DIAGNOSIS — B351 Tinea unguium: Secondary | ICD-10-CM

## 2022-06-10 MED ORDER — KETOCONAZOLE 2 % EX CREA: 1.0000 "application " | TOPICAL_CREAM | Freq: Every day | CUTANEOUS | 1 refills | Status: DC

## 2022-06-10 NOTE — Discharge Instructions (Signed)
-  Apply the ketoconazole cream around the cracked skin on your feet and nails once daily while symptoms persist.  Continue treatment for 1 week past when symptoms resolve. -If symptoms persist, follow-up with podiatry.  Information below.

## 2022-06-10 NOTE — ED Provider Notes (Signed)
[facility]    CSN: [id number] Arrival date & time: 06/10/22  1102      History   Chief Complaint Chief Complaint  Patient presents with   Nail Problem   Skin Problem    HPI Pamela Marshall is a 21 y.o. female presenting with bilateral little toe pain and a dry cracked skin on the feet.  History noncontributory.  She states symptoms for months, unsure of cause.  Has not attempted interventions at home.  She also states she has very dry skin on her shoulders and back, she does spend a lot of time outside and at the swimming pool.  HPI  Past Medical History:  Diagnosis Date   Asthma    Hypertension     Patient Active Problem List   Diagnosis Date Noted   Gonorrhea 09/09/2021   Chlamydia 09/09/2021    Past Surgical History:  Procedure Laterality Date   CYST REMOVAL HAND     TONSILLECTOMY AND ADENOIDECTOMY      OB History     Gravida  0   Para  0   Term  0   Preterm  0   AB  0   Living  0      SAB  0   IAB  0   Ectopic  0   Multiple  0   Live Births  0            Home Medications    Prior to Admission medications   Medication Sig Start Date End Date Taking? Authorizing Provider  ketoconazole (NIZORAL) 2 % cream Apply 1 application  topically daily. 06/10/22  Yes Rhys Martini, PA-C  etonogestrel (NEXPLANON) 68 MG IMPL implant 1 each (68 mg total) by Subdermal route once for 1 dose. 06/10/21 06/10/21  Conard Novak, MD    Family History Family History  Problem Relation Age of Onset   Diabetes Paternal Grandmother    Diabetes Paternal Grandfather    Diabetes Paternal Uncle     Social History Social History   Tobacco Use   Smoking status: Never   Smokeless tobacco: Never  Vaping Use   Vaping Use: Never used  Substance Use Topics   Alcohol use: Never   Drug use: Never     Allergies   Crab [shellfish allergy]   Review of Systems Review of Systems  Skin:  Positive for rash.  All other systems reviewed  and are negative.    Physical Exam Triage Vital Signs ED Triage Vitals  Enc Vitals Group     BP 06/10/22 1139 (!) 150/81     Pulse Rate 06/10/22 1139 79     Resp 06/10/22 1139 20     Temp 06/10/22 1139 98.2 F (36.8 C)     Temp Source 06/10/22 1139 Oral     SpO2 06/10/22 1139 97 %     Weight --      Height --      Head Circumference --      Peak Flow --      Pain Score 06/10/22 1135 0     Pain Loc --      Pain Edu? --      Excl. in GC? --    No data found.  Updated Vital Signs BP (!) 150/81 (BP Location: Left Arm) Comment (BP Location): regular cuff, forearm  Pulse 79   Temp 98.2 F (36.8 C) (Oral)   Resp 20   SpO2 97%   Visual  Acuity Right Eye Distance:   Left Eye Distance:   Bilateral Distance:    Right Eye Near:   Left Eye Near:    Bilateral Near:     Physical Exam Vitals reviewed.  Constitutional:      General: She is not in acute distress.    Appearance: Normal appearance. She is not ill-appearing.  HENT:     Head: Normocephalic and atraumatic.  Pulmonary:     Effort: Pulmonary effort is normal.  Skin:    Comments: Bilateral toes: dry cracked skin, worst between toes. Bilateral little toes with thick discolored hypertrophic nails. Bilateral posterior shoulders with dry skin.   Neurological:     General: No focal deficit present.     Mental Status: She is alert and oriented to person, place, and time.  Psychiatric:        Mood and Affect: Mood normal.        Behavior: Behavior normal.        Thought Content: Thought content normal.        Judgment: Judgment normal.      UC Treatments / Results  Labs (all labs ordered are listed, but only abnormal results are displayed) Labs Reviewed - No data to display  EKG   Radiology No results found.  Procedures Procedures (including critical care time)  Medications Ordered in UC Medications - No data to display  Initial Impression / Assessment and Plan / UC Course  I have reviewed the triage  vital signs and the nursing notes.  Pertinent labs & imaging results that were available during my care of the patient were reviewed by me and considered in my medical decision making (see chart for details).     This patient is a very pleasant 21 y.o. year old female presenting with onychomycosis, tinea pedis, and dry skin. Trial of ketoconazole, and f/u with podiatry. Rec thick emollient moisturizer for dry skin. ED return precautions discussed. Patient verbalizes understanding and agreement.   Final Clinical Impressions(s) / UC Diagnoses   Final diagnoses:  Onychomycosis  Tinea pedis of both feet     Discharge Instructions      -Apply the ketoconazole cream around the cracked skin on your feet and nails once daily while symptoms persist.  Continue treatment for 1 week past when symptoms resolve. -If symptoms persist, follow-up with podiatry.  Information below.   ED Prescriptions     Medication Sig Dispense Auth. Provider   ketoconazole (NIZORAL) 2 % cream Apply 1 application  topically daily. 30 g Rhys Martini, PA-C      PDMP not reviewed this encounter.   Rhys Martini, PA-C 06/10/22 1244

## 2022-06-10 NOTE — ED Triage Notes (Signed)
Patient mentions bilateral little toe pain, soreness.  Dry skin under toes, toe nail of little toes are dark.    Patient also concerned for dryness of skin on shoulder, back.  Reports spending a lot of time in swimming pool.

## 2022-07-10 ENCOUNTER — Ambulatory Visit: Payer: Medicaid Other | Admitting: Podiatry

## 2022-08-12 ENCOUNTER — Ambulatory Visit (INDEPENDENT_AMBULATORY_CARE_PROVIDER_SITE_OTHER): Payer: Medicaid Other | Admitting: Podiatry

## 2022-08-12 DIAGNOSIS — Z91199 Patient's noncompliance with other medical treatment and regimen due to unspecified reason: Secondary | ICD-10-CM

## 2022-08-12 NOTE — Progress Notes (Signed)
Patient was no-show for appointment today 

## 2022-09-02 ENCOUNTER — Ambulatory Visit (INDEPENDENT_AMBULATORY_CARE_PROVIDER_SITE_OTHER): Payer: Medicaid Other | Admitting: Podiatry

## 2022-09-02 DIAGNOSIS — Z91199 Patient's noncompliance with other medical treatment and regimen due to unspecified reason: Secondary | ICD-10-CM

## 2022-09-02 NOTE — Progress Notes (Signed)
Patient was no-show for appointment today 

## 2022-09-09 ENCOUNTER — Ambulatory Visit (INDEPENDENT_AMBULATORY_CARE_PROVIDER_SITE_OTHER): Payer: Medicaid Other | Admitting: Podiatry

## 2022-09-09 ENCOUNTER — Ambulatory Visit (INDEPENDENT_AMBULATORY_CARE_PROVIDER_SITE_OTHER): Payer: Medicaid Other

## 2022-09-09 DIAGNOSIS — S99912A Unspecified injury of left ankle, initial encounter: Secondary | ICD-10-CM | POA: Diagnosis not present

## 2022-09-09 DIAGNOSIS — M216X1 Other acquired deformities of right foot: Secondary | ICD-10-CM | POA: Diagnosis not present

## 2022-09-09 DIAGNOSIS — M958 Other specified acquired deformities of musculoskeletal system: Secondary | ICD-10-CM | POA: Diagnosis not present

## 2022-09-09 NOTE — Patient Instructions (Signed)
Call [facility] to schedule your MRI at the below locations.  Please allow at least 1 business day after your visit to process the referral.  It may take longer depending on approval from insurance.  Please let me know if you have issues or problems scheduling the MRI     [facility] [place] [phone] [address] [place], [place] [address]  

## 2022-09-11 ENCOUNTER — Encounter: Payer: Self-pay | Admitting: Podiatry

## 2022-09-11 NOTE — Progress Notes (Signed)
  Subjective:  Patient ID: Pamela Marshall, female    DOB: August 15, 2001,  MRN: [id number]  Chief Complaint  Patient presents with   Ankle Injury    Surgical consult, medial ankle osteocondritis fracture left     21 y.o. female presents with the above complaint. History confirmed with patient.  She injured the ankle a few years ago.  She had what appears to be an arthroscopic surgery, this was with [facility] in [facility].  She says it has not completely been 100% since then.  Her ankle remains swollen and tender  Objective:  Physical Exam: warm, good capillary refill, no trophic changes or ulcerative lesions, normal DP and PT pulses, and normal sensory exam. Left Foot:  Well-healed surgical incisions on the anterior medial and lateral ankle, she has pain and edema around the anterior joint line    Radiographs: Multiple views x-ray of left ankle: Notable lucencies in the medial and lateral talar domes Assessment:   1. Osteochondral defect of talus      Plan:  Patient was evaluated and treated and all questions answered.  Clearly continues to have some significant ankle pain.  She does have notable lucencies on her plain film radiographs.  She has not had imaging beyond x-rays in some time.  I recommended a new MRI to evaluate the osteochondral lesions.  I would like to get her op report as well.  I will see her back after the MRI for further follow-up  Return for after MRI to review.

## 2022-09-17 NOTE — Progress Notes (Signed)
Left a voicemail and faxed requesting patient report as well.

## 2022-09-19 NOTE — Progress Notes (Signed)
Also left a voicemail for the patient to request her records online as well. This is the quicker process of getting any records through [facility]

## 2022-09-19 NOTE — Progress Notes (Signed)
Left another voicemail with the medical records department and faxed the request over to them as well. Still waiting on a response

## 2022-09-21 ENCOUNTER — Other Ambulatory Visit: Payer: Medicaid Other

## 2022-09-25 ENCOUNTER — Encounter: Payer: Self-pay | Admitting: Podiatry

## 2022-10-03 ENCOUNTER — Inpatient Hospital Stay: Admission: RE | Admit: 2022-10-03 | Payer: Medicaid Other | Source: Ambulatory Visit

## 2022-10-04 ENCOUNTER — Emergency Department (HOSPITAL_COMMUNITY): Payer: Medicaid Other

## 2022-10-04 ENCOUNTER — Emergency Department (HOSPITAL_COMMUNITY)
Admission: EM | Admit: 2022-10-04 | Discharge: 2022-10-04 | Disposition: A | Discharge status: Home / Self Care | Payer: Medicaid Other | Attending: Emergency Medicine | Admitting: Emergency Medicine

## 2022-10-04 ENCOUNTER — Other Ambulatory Visit: Payer: Self-pay

## 2022-10-04 ENCOUNTER — Encounter (HOSPITAL_COMMUNITY): Payer: Self-pay | Admitting: Emergency Medicine

## 2022-10-04 DIAGNOSIS — R197 Diarrhea, unspecified: Secondary | ICD-10-CM | POA: Diagnosis not present

## 2022-10-04 DIAGNOSIS — N739 Female pelvic inflammatory disease, unspecified: Secondary | ICD-10-CM | POA: Diagnosis not present

## 2022-10-04 DIAGNOSIS — R1030 Lower abdominal pain, unspecified: Secondary | ICD-10-CM

## 2022-10-04 DIAGNOSIS — R112 Nausea with vomiting, unspecified: Secondary | ICD-10-CM | POA: Insufficient documentation

## 2022-10-04 DIAGNOSIS — N730 Acute parametritis and pelvic cellulitis: Secondary | ICD-10-CM

## 2022-10-04 LAB — URINALYSIS, ROUTINE W REFLEX MICROSCOPIC
Bacteria, UA: NONE SEEN
Bilirubin Urine: NEGATIVE
Glucose, UA: NEGATIVE mg/dL
Ketones, ur: 80 mg/dL — AB
Nitrite: NEGATIVE
Protein, ur: 30 mg/dL — AB
RBC / HPF: >50 RBC/hpf — ABNORMAL HIGH (ref 0–5)
Specific Gravity, Urine: 1.018 (ref 1.005–1.030)
pH: 7 (ref 5.0–8.0)

## 2022-10-04 LAB — LIPASE, BLOOD: Lipase: 29 U/L (ref 11–51)

## 2022-10-04 LAB — CBC WITH DIFFERENTIAL/PLATELET
Abs Immature Granulocytes: 0.04 10*3/uL (ref 0.00–0.07)
Basophils Absolute: 0 10*3/uL (ref 0.0–0.1)
Basophils Relative: 0 %
Eosinophils Absolute: 0.1 10*3/uL (ref 0.0–0.5)
Eosinophils Relative: 0 %
HCT: 40.9 % (ref 36.0–46.0)
Hemoglobin: 13.4 g/dL (ref 12.0–15.0)
Immature Granulocytes: 0 %
Lymphocytes Relative: 11 %
Lymphs Abs: 1.4 10*3/uL (ref 0.7–4.0)
MCH: 31.8 pg (ref 26.0–34.0)
MCHC: 32.8 g/dL (ref 30.0–36.0)
MCV: 96.9 fL (ref 80.0–100.0)
Monocytes Absolute: 0.5 10*3/uL (ref 0.1–1.0)
Monocytes Relative: 4 %
Neutro Abs: 11.1 10*3/uL — ABNORMAL HIGH (ref 1.7–7.7)
Neutrophils Relative %: 85 %
Platelets: 217 10*3/uL (ref 150–400)
RBC: 4.22 MIL/uL (ref 3.87–5.11)
RDW: 12.5 % (ref 11.5–15.5)
WBC: 13.2 10*3/uL — ABNORMAL HIGH (ref 4.0–10.5)
nRBC: 0 % (ref 0.0–0.2)

## 2022-10-04 LAB — HIV ANTIBODY (ROUTINE TESTING W REFLEX): HIV Screen 4th Generation wRfx: NONREACTIVE

## 2022-10-04 LAB — WET PREP, GENITAL
Sperm: NONE SEEN
Trich, Wet Prep: NONE SEEN
WBC, Wet Prep HPF POC: >=10 — AB (ref <10)
Yeast Wet Prep HPF POC: NONE SEEN

## 2022-10-04 LAB — COMPREHENSIVE METABOLIC PANEL
ALT: 18 U/L (ref 0–44)
AST: 17 U/L (ref 15–41)
Albumin: 4 g/dL (ref 3.5–5.0)
Alkaline Phosphatase: 41 U/L (ref 38–126)
Anion gap: 9 (ref 5–15)
BUN: 7 mg/dL (ref 6–20)
CO2: 25 mmol/L (ref 22–32)
Calcium: 9.4 mg/dL (ref 8.9–10.3)
Chloride: 105 mmol/L (ref 98–111)
Creatinine, Ser: 0.78 mg/dL (ref 0.44–1.00)
GFR, Estimated: >60 mL/min (ref >60)
Glucose, Bld: 88 mg/dL (ref 70–99)
Potassium: 3.9 mmol/L (ref 3.5–5.1)
Sodium: 139 mmol/L (ref 135–145)
Total Bilirubin: 1.6 mg/dL — ABNORMAL HIGH (ref 0.3–1.2)
Total Protein: 7.3 g/dL (ref 6.5–8.1)

## 2022-10-04 LAB — I-STAT BETA HCG BLOOD, ED (MC, WL, AP ONLY): I-stat hCG, quantitative: <5 m[IU]/mL (ref <5)

## 2022-10-04 LAB — RPR: RPR Ser Ql: NONREACTIVE

## 2022-10-04 MED ORDER — ACETAMINOPHEN 500 MG PO TABS
1000.0000 mg | ORAL_TABLET | ORAL | Status: AC
  Administered 2022-10-04: 1000 mg via ORAL
  Filled 2022-10-04: qty 2

## 2022-10-04 MED ORDER — METRONIDAZOLE 500 MG/100ML IV SOLN
500.0000 mg | INTRAVENOUS | Status: AC
  Administered 2022-10-04: 500 mg via INTRAVENOUS
  Filled 2022-10-04: qty 100

## 2022-10-04 MED ORDER — KETOROLAC TROMETHAMINE 15 MG/ML IJ SOLN: 15.0000 mg | Freq: Once | INTRAMUSCULAR | Status: DC

## 2022-10-04 MED ORDER — LACTATED RINGERS IV BOLUS
1000.0000 mL | Freq: Once | INTRAVENOUS | Status: AC
  Administered 2022-10-04: 1000 mL via INTRAVENOUS

## 2022-10-04 MED ORDER — DOXYCYCLINE HYCLATE 100 MG PO CAPS: 100.0000 mg | ORAL_CAPSULE | Freq: Two times a day (BID) | ORAL | 0 refills | Status: AC

## 2022-10-04 MED ORDER — DOXYCYCLINE HYCLATE 100 MG PO TABS
100.0000 mg | ORAL_TABLET | Freq: Once | ORAL | Status: AC
  Administered 2022-10-04: 100 mg via ORAL
  Filled 2022-10-04: qty 1

## 2022-10-04 MED ORDER — OXYCODONE HCL 5 MG PO TABS
5.0000 mg | ORAL_TABLET | ORAL | Status: AC
  Administered 2022-10-04: 5 mg via ORAL
  Filled 2022-10-04: qty 1

## 2022-10-04 MED ORDER — DEXTROSE 5 % IV SOLN
500.0000 mg | INTRAVENOUS | Status: AC
  Administered 2022-10-04: 500 mg via INTRAVENOUS
  Filled 2022-10-04 (×2): qty 500

## 2022-10-04 MED ORDER — ONDANSETRON HCL 4 MG/2ML IJ SOLN
4.0000 mg | Freq: Once | INTRAMUSCULAR | Status: AC
  Administered 2022-10-04: 4 mg via INTRAVENOUS
  Filled 2022-10-04: qty 2

## 2022-10-04 MED ORDER — MORPHINE SULFATE (PF) 4 MG/ML IV SOLN
4.0000 mg | Freq: Once | INTRAVENOUS | Status: AC
  Administered 2022-10-04: 4 mg via INTRAVENOUS
  Filled 2022-10-04: qty 1

## 2022-10-04 MED ORDER — IOHEXOL 350 MG/ML SOLN
85.0000 mL | Freq: Once | INTRAVENOUS | Status: AC | PRN
  Administered 2022-10-04: 85 mL via INTRAVENOUS

## 2022-10-04 MED ORDER — METRONIDAZOLE 500 MG PO TABS: 500.0000 mg | ORAL_TABLET | Freq: Two times a day (BID) | ORAL | 0 refills | Status: DC

## 2022-10-04 MED ORDER — MORPHINE SULFATE (PF) 4 MG/ML IV SOLN
4.0000 mg | Freq: Once | INTRAVENOUS | Status: DC
  Filled 2022-10-04: qty 1

## 2022-10-04 NOTE — ED Triage Notes (Signed)
PT to ER with c/o sever abdominal pain that started yesterday. Pt reports n/v this AM.  Pt denies urinary s/s.

## 2022-10-04 NOTE — Discharge Instructions (Signed)
Today you were seen in the emergency department for your abdominal pain and found to have PID.    In the emergency department you had labs that were sent and a pelvic ultrasound and CT of your abdomen which did not show any surgical causes of your pain.    At home, please take Tylenol and Motrin as needed for your pain as well as antibiotics we have prescribed you which will help with your pain.    Check your MyChart online for the results of any tests that had not resulted by the time you left the emergency department.   Follow-up with your primary doctor in 2-3 days regarding your visit.    Return immediately to the emergency department if you experience any of the following: Worsening pain, high fevers, fainting, or any other concerning symptoms.    Thank you for visiting our Emergency Department. It was a pleasure taking care of you today.

## 2022-10-04 NOTE — ED Notes (Signed)
Pt states after 4 mg of Morphine IV that her pain has only dropped to a 8/10.

## 2022-10-04 NOTE — ED Provider Notes (Signed)
[facility] EMERGENCY DEPARTMENT Provider Note   CSN: [id number] Arrival date & time: 10/04/22  0920     History  Chief Complaint  Patient presents with   Abdominal Pain    Pamela Marshall is a 21 y.o. female.  21 year old female with a history of gonorrhea and chlamydia status posttreatment who presents the emergency department with lower abdominal pain.  Patient states that for the past 2 days she has had lower abdominal pain.  Says that it has been constant was severe in onset.  Says that it is suprapubic.  Denies any urinary symptoms including dysuria or frequency.  Says that she had her period weeks ago but has had persistent vaginal spotting and mild bleeding since then.  Denies any additional vaginal discharge.  Does not have any history of abdominal surgeries.  States that she has not been sexually active in over 2 months.  Also states that she has been having nausea and vomiting and diarrhea.  Describes the vomiting is a dark yellow color.  Says that the pain was so severe that she decided to come into the emergency department for evaluation.       Home Medications Prior to Admission medications   Medication Sig Start Date End Date Taking? Authorizing Provider  doxycycline (VIBRAMYCIN) 100 MG capsule Take 1 capsule (100 mg total) by mouth 2 (two) times daily for 14 days. 10/04/22 10/18/22 Yes Fransico Meadow, MD  metroNIDAZOLE (FLAGYL) 500 MG tablet Take 1 tablet (500 mg total) by mouth 2 (two) times daily. 10/04/22  Yes Fransico Meadow, MD  etonogestrel (NEXPLANON) 68 MG IMPL implant 1 each (68 mg total) by Subdermal route once for 1 dose. 06/10/21 06/10/21  Will Bonnet, MD  ketoconazole (NIZORAL) 2 % cream Apply 1 application  topically daily. 06/10/22   Hazel Sams, PA-C      Allergies    Crab [shellfish allergy] and Shellfish-derived products    Review of Systems   Review of Systems  Physical Exam Updated Vital Signs BP 138/72    Pulse 90   Temp 97.9 F (36.6 C)   Resp 18   Ht 5\' 2"  (1.575 m)   Wt 99.8 kg   SpO2 100%   BMI 40.24 kg/m  Physical Exam Vitals and nursing note reviewed.  Constitutional:      General: She is not in acute distress.    Appearance: She is well-developed.  HENT:     Head: Normocephalic and atraumatic.     Right Ear: External ear normal.     Left Ear: External ear normal.     Nose: Nose normal.  Eyes:     Extraocular Movements: Extraocular movements intact.     Conjunctiva/sclera: Conjunctivae normal.     Pupils: Pupils are equal, round, and reactive to light.  Cardiovascular:     Rate and Rhythm: Normal rate and regular rhythm.     Heart sounds: No murmur heard. Pulmonary:     Effort: Pulmonary effort is normal. No respiratory distress.     Breath sounds: Normal breath sounds.  Abdominal:     General: Abdomen is flat. There is no distension.     Palpations: Abdomen is soft. There is no mass.     Tenderness: There is abdominal tenderness (Diffuse most severe in suprapubic area). There is no guarding.  Musculoskeletal:        General: No swelling.     Cervical back: Normal range of motion and neck supple.  Right lower leg: No edema.     Left lower leg: No edema.  Skin:    General: Skin is warm and dry.     Capillary Refill: Capillary refill takes less than 2 seconds.  Neurological:     Mental Status: She is alert and oriented to person, place, and time. Mental status is at baseline.  Psychiatric:        Mood and Affect: Mood normal.     ED Results / Procedures / Treatments   Labs (all labs ordered are listed, but only abnormal results are displayed) Labs Reviewed  WET PREP, GENITAL - Abnormal; Notable for the following components:      Result Value   Clue Cells Wet Prep HPF POC PRESENT (*)    WBC, Wet Prep HPF POC >=10 (*)    All other components within normal limits  COMPREHENSIVE METABOLIC PANEL - Abnormal; Notable for the following components:   Total  Bilirubin 1.6 (*)    All other components within normal limits  CBC WITH DIFFERENTIAL/PLATELET - Abnormal; Notable for the following components:   WBC 13.2 (*)    Neutro Abs 11.1 (*)    All other components within normal limits  URINALYSIS, ROUTINE W REFLEX MICROSCOPIC - Abnormal; Notable for the following components:   APPearance HAZY (*)    Hgb urine dipstick LARGE (*)    Ketones, ur 80 (*)    Protein, ur 30 (*)    Leukocytes,Ua TRACE (*)    RBC / HPF >50 (*)    All other components within normal limits  URINE CULTURE  LIPASE, BLOOD  HIV ANTIBODY (ROUTINE TESTING W REFLEX)  RPR  I-STAT BETA HCG BLOOD, ED ([facility], [facility], [facility] ONLY)  GC/CHLAMYDIA PROBE AMP ([place]) NOT AT [facility]    EKG None  Radiology CT ABDOMEN PELVIS W CONTRAST  Result Date: 10/04/2022 CLINICAL DATA:  Lower abdominal pain, nausea, vomiting EXAM: CT ABDOMEN AND PELVIS WITH CONTRAST TECHNIQUE: Multidetector CT imaging of the abdomen and pelvis was performed using the standard protocol following bolus administration of intravenous contrast. RADIATION DOSE REDUCTION: This exam was performed according to the departmental dose-optimization program which includes automated exposure control, adjustment of the mA and/or kV according to patient size and/or use of iterative reconstruction technique. CONTRAST:  36mL OMNIPAQUE IOHEXOL 350 MG/ML SOLN COMPARISON:  06/23/2017 FINDINGS: Lower chest: Unremarkable. Hepatobiliary: Liver measures 18.5 cm in length. No focal abnormalities are seen. There is no dilation of bile ducts. Gallbladder is unremarkable. Pancreas: No focal abnormalities are seen. Spleen: Unremarkable. Adrenals/Urinary Tract: Adrenals are unremarkable. There is no hydronephrosis. There are no renal or ureteral stones. Urinary bladder is not distended. Stomach/Bowel: Stomach is unremarkable. Small bowel loops are not dilated. Appendix is difficult to visualize. In image 64 of series 4, there is a small caliber tubular  structure, possibly normal appendix adjacent to the cecum. There is fluid in the lumen of the colon and rectum. There is no focal wall thickening in colon. There is no pericolic stranding. Vascular/Lymphatic: Vascular structures are unremarkable. There are subcentimeter nodes in retroperitoneum and mesentery. Reproductive: Unremarkable. Other: There is no ascites or pneumoperitoneum. Small umbilical hernia containing fat is seen. Musculoskeletal: No acute findings are seen. IMPRESSION: There is no evidence of intestinal obstruction or pneumoperitoneum. There is no hydronephrosis. Other findings as described in the body of the report. Electronically Signed   By: Ernie Avena M.D.   On: 10/04/2022 14:35   US Pelvis Complete  Result Date: 10/04/2022 CLINICAL DATA:  21 year old female with lower abdominal pain and vaginal bleeding. EXAM: TRANSABDOMINAL ULTRASOUND OF PELVIS TECHNIQUE: Transabdominal ultrasound examination of the pelvis was performed including evaluation of the uterus, ovaries, adnexal regions, and pelvic cul-de-sac. COMPARISON:  CT Abdomen and Pelvis 06/23/2017. FINDINGS: Uterus Measurements: 6.9 x 2.8 x 4.7 cm = volume: 47 mL. No fibroids or other mass visualized. Endometrium Thickness: 5 mm.  No focal abnormality visualized. Right ovary Measurements: Approximately 2.1 x 2.8 x 2.1 cm = volume: 9 mL. Within normal limits. Left ovary Measurements: Approximately 2.5 x 1.5 x 2.0 cm = volume: 4 mL. Within normal limits. Other findings: No pelvic free fluid identified. No urinary debris identified within the bladder. IMPRESSION: Pelvis Ultrasound is within normal limits. Electronically Signed   By: Genevie Ann M.D.   On: 10/04/2022 11:53    Procedures Procedures   Medications Ordered in ED Medications  morphine (PF) 4 MG/ML injection 4 mg (4 mg Intravenous Given 10/04/22 1108)  ondansetron (ZOFRAN) injection 4 mg (4 mg Intravenous Given 10/04/22 1109)  lactated ringers bolus 1,000 mL (0 mLs  Intravenous Stopped 10/04/22 1527)  acetaminophen (TYLENOL) tablet 1,000 mg (1,000 mg Oral Given 10/04/22 1107)  cefTRIAXone (ROCEPHIN) 500 mg in dextrose 5 % 50 mL IVPB (0 mg Intravenous Stopped 10/04/22 1558)  doxycycline (VIBRA-TABS) tablet 100 mg (100 mg Oral Given 10/04/22 1340)  metroNIDAZOLE (FLAGYL) IVPB 500 mg (0 mg Intravenous Stopped 10/04/22 1552)  oxyCODONE (Oxy IR/ROXICODONE) immediate release tablet 5 mg (5 mg Oral Given 10/04/22 1341)  iohexol (OMNIPAQUE) 350 MG/ML injection 85 mL (85 mLs Intravenous Contrast Given 10/04/22 1421)    ED Course/ Medical Decision Making/ A&P                           Medical Decision Making Amount and/or Complexity of Data Reviewed Labs: ordered. Radiology: ordered.  Risk OTC drugs. Prescription drug management.   Pamela Marshall is a 21 y.o. female with comorbidities that complicate the patient evaluation including a history of gonorrhea and chlamydia status posttreatment who presents the emergency department with lower abdominal pain.    This patient presents to the ED for concern of complaints listed in HPI, this involves an extensive number of treatment options, and is a complaint that carries with it a high risk of complications and morbidity.   Initial Ddx:  PID, ovarian pathology, UTI, appendicitis  MDM:  Feel the patient likely has PID or UTI given the location of her abdominal pain.  Does not appear to localize to 1 side of her abdomen.  This time does not have any dysuria or frequency to be concerning for UTI but will test her urine for signs of infection.  With ovarian pathology on the differential and given the patient's age and risk for infertility we will obtain a pelvic ultrasound at this time.  We will consider additional imaging of her symptoms do not after initial treatment and if we do not have a diagnosis based on her initial evaluation.  Plan:  Labs hCG Pelvic exam with wet prep, GC, and chlamydia Patient declined HIV  and syphilis Morphine Zofran  ED Summary:  Patient underwent the above work-up which did show evidence of clue cells and did have CMT on her pelvic exam which was concerning for possible PID.  Her pelvic ultrasound did not reveal any acute findings.  Was still having significant abdominal pain so underwent CT scan that did not show any acute findings.  She was  treated empirically for PID and sent home with a prescription of doxycycline and metronidazole for her symptoms.  Also instructed that she will need to follow-up with her primary doctor in several days for her symptoms.  Did review the patient's urinalysis which showed significant blood as well as protein and trace leukocyte Estrace.  Feel this is likely due to her vaginal bleeding rather than UTI.  However, did send cultures for analysis.  Dispo: DC Home. Return precautions discussed including, but not limited to, those listed in the AVS. Allowed pt time to ask questions which were answered fully prior to dc.  Records reviewed Care Everywhere The following labs were independently interpreted: Urinalysis  I personally reviewed and interpreted cardiac monitoring: normal sinus rhythm  I personally reviewed and interpreted the pt's EKG: see above for interpretation  I have reviewed the patients home medications and made adjustments as needed  Final Clinical Impression(s) / ED Diagnoses Final diagnoses:  Lower abdominal pain  PID (acute pelvic inflammatory disease)    Rx / DC Orders ED Discharge Orders          Ordered    doxycycline (VIBRAMYCIN) 100 MG capsule  2 times daily        10/04/22 1550    metroNIDAZOLE (FLAGYL) 500 MG tablet  2 times daily        10/04/22 1550              Fransico Meadow, MD 10/04/22 1759

## 2022-10-06 LAB — GC/CHLAMYDIA PROBE AMP (~~LOC~~) NOT AT ARMC
Chlamydia: POSITIVE — AB
Comment: NEGATIVE
Comment: NORMAL
Neisseria Gonorrhea: POSITIVE — AB

## 2022-10-09 ENCOUNTER — Encounter: Payer: Self-pay | Admitting: Podiatry

## 2023-01-28 ENCOUNTER — Emergency Department (HOSPITAL_COMMUNITY)
Admission: EM | Admit: 2023-01-28 | Discharge: 2023-01-28 | Disposition: A | Discharge status: Home / Self Care | Payer: Medicaid Other | Attending: Emergency Medicine | Admitting: Emergency Medicine

## 2023-01-28 DIAGNOSIS — B349 Viral infection, unspecified: Secondary | ICD-10-CM | POA: Insufficient documentation

## 2023-01-28 DIAGNOSIS — R519 Headache, unspecified: Secondary | ICD-10-CM | POA: Diagnosis present

## 2023-01-28 DIAGNOSIS — Z20822 Contact with and (suspected) exposure to covid-19: Secondary | ICD-10-CM | POA: Insufficient documentation

## 2023-01-28 LAB — RESP PANEL BY RT-PCR (RSV, FLU A&B, COVID)  RVPGX2
Influenza A by PCR: NEGATIVE
Influenza B by PCR: NEGATIVE
Resp Syncytial Virus by PCR: NEGATIVE
SARS Coronavirus 2 by RT PCR: NEGATIVE

## 2023-01-28 LAB — GROUP A STREP BY PCR: Group A Strep by PCR: NOT DETECTED

## 2023-01-28 NOTE — Discharge Instructions (Addendum)
You tested negative today for Covid 19, influenza A, B and RSV.  Continue to hydrate with plenty of fluids, take tylenol versus ibuprofen to help with pain.

## 2023-01-28 NOTE — ED Provider Notes (Signed)
[facility] Provider Note   CSN: [id number] Arrival date & time: 01/28/23  1012     History  Chief Complaint  Patient presents with   URI   Sore Throat    Pamela Marshall is a 22 y.o. female.  22 y.o female with a PMH of Asthma presents to the ED with a chief complaint of headache, malaisea, nausea since yesterday. Has not taken anything for her symptoms. She does use a vape. She reports feeling sob sometimes but no increase in inhaler use.  She did work overnight, per exacerbation in symptoms.  NO alleviating or exacerbating factors. No fever, no chest pain or other complaints.   The history is provided by the patient.  URI Presenting symptoms: no fever and no sore throat   Sore Throat Pertinent negatives include no chest pain, no abdominal pain and no shortness of breath.       Home Medications Prior to Admission medications   Medication Sig Start Date End Date Taking? Authorizing Provider  etonogestrel (NEXPLANON) 68 MG IMPL implant 1 each (68 mg total) by Subdermal route once for 1 dose. 06/10/21 06/10/21  Will Bonnet, MD  ketoconazole (NIZORAL) 2 % cream Apply 1 application  topically daily. 06/10/22   Hazel Sams, PA-C  metroNIDAZOLE (FLAGYL) 500 MG tablet Take 1 tablet (500 mg total) by mouth 2 (two) times daily. 10/04/22   Fransico Meadow, MD      Allergies    Otho Darner allergy] and Shellfish-derived products    Review of Systems   Review of Systems  Constitutional:  Positive for chills. Negative for fever.  HENT:  Negative for sore throat.   Respiratory:  Negative for shortness of breath.   Cardiovascular:  Negative for chest pain.  Gastrointestinal:  Negative for abdominal pain, nausea and vomiting.  Genitourinary:  Negative for flank pain.    Physical Exam Updated Vital Signs BP (!) 142/96 (BP Location: Left Arm)   Pulse 93   Temp 97.9 F (36.6 C) (Oral)   Resp 18   SpO2 100%  Physical  Exam Vitals and nursing note reviewed.  Constitutional:      Appearance: She is well-developed.  HENT:     Head: Normocephalic and atraumatic.     Nose: No congestion or rhinorrhea.     Mouth/Throat:     Mouth: Mucous membranes are moist.     Pharynx: Uvula midline.     Tonsils: No tonsillar exudate or tonsillar abscesses.  Cardiovascular:     Rate and Rhythm: Normal rate.  Pulmonary:     Effort: Pulmonary effort is normal.     Breath sounds: No wheezing or rales.  Abdominal:     Palpations: Abdomen is soft.  Skin:    General: Skin is warm and dry.  Neurological:     Mental Status: She is alert.     ED Results / Procedures / Treatments   Labs (all labs ordered are listed, but only abnormal results are displayed) Labs Reviewed  RESP PANEL BY RT-PCR (RSV, FLU A&B, COVID)  [id number]  GROUP A STREP BY PCR    EKG None  Radiology No results found.  Procedures Procedures    Medications Ordered in ED Medications - No data to display  ED Course/ Medical Decision Making/ A&P  Medical Decision Making    Patient presents to the ED with upper respiratory symptoms, chills, nausea which began suddenly yesterday.  Has taken no medication to help improve her symptoms.  Denies any tobacco use, however does vape, has not had any increase in her inhaler.  Vitals on arrival are within normal limits, no hypoxia, no tachycardia.  Patient did work overnight, appears somewhat tired.  Her exam is unremarkable, oropharynx is clear, prior tonsillectomy noted.  Erythema is present but uvula is midline without any signs of infection.  Lungs are clear to auscultation with no wheezing, rhonchi or rales.  Chest is nontender to palpation.  Respiratory panel is negative for COVID-19, influenza, RSV.  These results were discussed with patient at length, we discussed appropriate follow-up with PCP.  Patient is agreeable to plan treatment, stable for discharge.   Portions  of this note were generated with [employer]. Dictation errors may occur despite best attempts at proofreading.   Final Clinical Impression(s) / ED Diagnoses Final diagnoses:  Viral illness    Rx / DC Orders ED Discharge Orders     None         Janeece Fitting, PA-C 01/28/23 [place], MD 01/30/23 [phone]

## 2023-01-28 NOTE — ED Triage Notes (Signed)
Pt c/o URI symptoms including headache, malaise, chills, congestion, nausea since yesterday.

## 2023-02-15 NOTE — Progress Notes (Unsigned)
Patient, No Pcp Per   No chief complaint on file.   HPI:      Ms. DAYJAH CONTEE is a 22 y.o. G0P0000 whose LMP was No LMP recorded. Patient has had an implant., presents today for *** nexplanon replaced 06/10/21  Diagnosed with PID in ED 10/23 with pos gon/chlam. Due for [facility]  Patient Active Problem List   Diagnosis Date Noted   Gonorrhea 09/09/2021   Chlamydia 09/09/2021    Past Surgical History:  Procedure Laterality Date   CYST REMOVAL HAND     TONSILLECTOMY AND ADENOIDECTOMY      Family History  Problem Relation Age of Onset   Diabetes Paternal Grandmother    Diabetes Paternal Grandfather    Diabetes Paternal Uncle     Social History   Socioeconomic History   Marital status: Single    Spouse name: Not on file   Number of children: Not on file   Years of education: Not on file   Highest education level: Not on file  Occupational History   Not on file  Tobacco Use   Smoking status: Never   Smokeless tobacco: Never  Vaping Use   Vaping Use: Never used  Substance and Sexual Activity   Alcohol use: Never   Drug use: Never   Sexual activity: Not Currently    Birth control/protection: Implant  Other Topics Concern   Not on file  Social History Narrative   Not on file   Social Determinants of Health   Financial Resource Strain: Not on file  Food Insecurity: Not on file  Transportation Needs: Not on file  Physical Activity: Not on file  Stress: Not on file  Social Connections: Not on file  Intimate Partner Violence: Not on file    Outpatient Medications Prior to Visit  Medication Sig Dispense Refill   etonogestrel (NEXPLANON) 68 MG IMPL implant 1 each (68 mg total) by Subdermal route once for 1 dose. 1 each 0   ketoconazole (NIZORAL) 2 % cream Apply 1 application  topically daily. 30 g 1   metroNIDAZOLE (FLAGYL) 500 MG tablet Take 1 tablet (500 mg total) by mouth 2 (two) times daily. 14 tablet 0   Facility-Administered Medications Prior to  Visit  Medication Dose Route Frequency Provider Last Rate Last Admin   cefTRIAXone (ROCEPHIN) injection 500 mg  500 mg Intramuscular Once Katalin Colledge B, PA-C       triamcinolone acetonide (KENALOG) 10 MG/ML injection 10 mg  10 mg Other Once Daylene Katayama M, DPM          ROS:  Review of Systems BREAST: No symptoms   OBJECTIVE:   Vitals:  There were no vitals taken for this visit.  Physical Exam  Results: No results found for this or any previous visit (from the past 24 hour(s)).   No chief complaint on file.    History of Present Illness:  LYDIANNA GIANGRANDE is a 22 y.o. that had a nexplanon placed approximately {NUMBERS:20191} {MONTH/YR:310907}  ago. Since that time, she  ***.  There were no vitals taken for this visit.   Nexplanon removal Procedure note - The Nexplanon was noted in the patient's arm and the end was identified. The skin was cleansed with a Betadine solution. A small injection of subcutaneous lidocaine with epinephrine was given over the end of the implant. An incision was made at the end of the implant. The rod was noted in the incision and grasped with a hemostat. It was  noted to be intact.  Steri-Strip was placed approximating the incision. Hemostasis was noted.  Assessment: Nexplanon removal   Plan:   She was told to remove the dressing in 12-24 hours, to keep the incision area dry for 24 hours and to remove the Steristrip in 2-3  days.  Notify us if any signs of tenderness, redness, pain, or fevers develop.   Chance Munter B. Jessina Marse, PA-C 02/15/2023 7:53 PM   Assessment/Plan: No diagnosis found.    No orders of the defined types were placed in this encounter.     No follow-ups on file.  Jaylise Peek B. Caven Perine, PA-C 02/15/2023 7:51 PM

## 2023-02-16 ENCOUNTER — Encounter: Payer: Self-pay | Admitting: Obstetrics and Gynecology

## 2023-02-16 ENCOUNTER — Ambulatory Visit (INDEPENDENT_AMBULATORY_CARE_PROVIDER_SITE_OTHER): Payer: Medicaid Other | Admitting: Obstetrics and Gynecology

## 2023-02-16 ENCOUNTER — Other Ambulatory Visit (HOSPITAL_COMMUNITY)
Admission: RE | Admit: 2023-02-16 | Discharge: 2023-02-16 | Disposition: A | Discharge status: Home / Self Care | Payer: Medicaid Other | Source: Ambulatory Visit | Attending: Obstetrics and Gynecology | Admitting: Obstetrics and Gynecology

## 2023-02-16 VITALS — BP 114/73 | HR 90 | Ht 62.0 in | Wt 247.0 lb

## 2023-02-16 DIAGNOSIS — Z3046 Encounter for surveillance of implantable subdermal contraceptive: Secondary | ICD-10-CM

## 2023-02-16 DIAGNOSIS — Z113 Encounter for screening for infections with a predominantly sexual mode of transmission: Secondary | ICD-10-CM

## 2023-02-16 DIAGNOSIS — A749 Chlamydial infection, unspecified: Secondary | ICD-10-CM | POA: Diagnosis not present

## 2023-02-16 DIAGNOSIS — A549 Gonococcal infection, unspecified: Secondary | ICD-10-CM | POA: Diagnosis not present

## 2023-02-16 NOTE — Patient Instructions (Signed)
I value your feedback and you entrusting [place] with your care. If you get a [facility] patient survey, I would appreciate you taking the time to let us know about your experience today. Thank you!  Remove the dressing in 24 hours,  keep the incision area dry for 24 hours and remove the Steristrip in 2-3  days.  Notify us if any signs of tenderness, redness, pain, or fevers develop.

## 2023-02-16 NOTE — Addendum Note (Signed)
Addended by: Ardeth Perfect B on: 0000000 02:38 PM   Modules accepted: Orders

## 2023-02-18 LAB — CERVICOVAGINAL ANCILLARY ONLY
Chlamydia: NEGATIVE
Comment: NEGATIVE
Comment: NORMAL
Neisseria Gonorrhea: NEGATIVE

## 2023-02-19 ENCOUNTER — Encounter: Payer: Self-pay | Admitting: Obstetrics and Gynecology

## 2023-05-15 ENCOUNTER — Telehealth: Payer: Self-pay | Admitting: Obstetrics and Gynecology

## 2023-05-15 NOTE — Telephone Encounter (Signed)
Left message for patient to call office back to schedule annual appt 

## 2023-07-27 NOTE — Telephone Encounter (Signed)
As of 07/27/2023, pt had not contacted office to schedule annual appt.

## 2024-01-28 ENCOUNTER — Encounter (HOSPITAL_COMMUNITY): Payer: Self-pay | Admitting: Family Medicine

## 2024-01-28 ENCOUNTER — Ambulatory Visit (HOSPITAL_COMMUNITY)
Admission: EM | Admit: 2024-01-28 | Discharge: 2024-01-28 | Disposition: A | Discharge status: Home / Self Care | Payer: Medicaid Other | Attending: Emergency Medicine | Admitting: Emergency Medicine

## 2024-01-28 DIAGNOSIS — J069 Acute upper respiratory infection, unspecified: Secondary | ICD-10-CM

## 2024-01-28 DIAGNOSIS — J111 Influenza due to unidentified influenza virus with other respiratory manifestations: Secondary | ICD-10-CM | POA: Diagnosis not present

## 2024-01-28 HISTORY — DX: Tinea pedis: B35.3

## 2024-01-28 LAB — POC COVID19/FLU A&B COMBO
Covid Antigen, POC: NEGATIVE
Influenza A Antigen, POC: NEGATIVE
Influenza B Antigen, POC: NEGATIVE

## 2024-01-28 MED ORDER — IPRATROPIUM BROMIDE 0.06 % NA SOLN: 2.0000 | Freq: Three times a day (TID) | NASAL | 1 refills | Status: AC

## 2024-01-28 MED ORDER — GUAIFENESIN 400 MG PO TABS: ORAL_TABLET | ORAL | 0 refills | Status: AC

## 2024-01-28 MED ORDER — CETIRIZINE HCL 10 MG PO TABS: 10.0000 mg | ORAL_TABLET | Freq: Every day | ORAL | 2 refills | Status: AC

## 2024-01-28 MED ORDER — PROMETHAZINE-DM 6.25-15 MG/5ML PO SYRP: 5.0000 mL | ORAL_SOLUTION | Freq: Every evening | ORAL | 0 refills | Status: AC | PRN

## 2024-01-28 NOTE — ED Provider Notes (Signed)
[facility]    CSN: [id number] Arrival date & time: 01/28/24  4403    HISTORY   Chief Complaint  Patient presents with   Sore Throat   Generalized Body Aches   Headache   Diarrhea   HPI Pamela Marshall is a pleasant, 23 y.o. female who presents to urgent care today. Patient complains of a 2-day history of sore throat, body aches, headache and diarrhea.  Patient states she is also had a cough productive of "big green gobs" of sputum.  Patient states 2 family members at home have had similar symptoms she is the last 1 to have symptoms.  Patient states she has been taking Tylenol and ibuprofen, last dose of Tylenol was taken at 815 this morning.  Patient denies nausea, vomiting, otalgia, sinusitis, nasal congestion, runny nose, shortness of breath.  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  States she has been advised by her supervisor that she cannot return to work until she has been tested for RSV, influenza and COVID-19 and treated as needed based on those results.  The history is provided by the patient.   Past Medical History:  Diagnosis Date   Asthma    Athlete's foot    Hypertension    Patient Active Problem List   Diagnosis Date Noted   Gonorrhea 09/09/2021   Chlamydia 09/09/2021   Past Surgical History:  Procedure Laterality Date   CYST REMOVAL HAND     TONSILLECTOMY AND ADENOIDECTOMY     OB History     Gravida  0   Para  0   Term  0   Preterm  0   AB  0   Living  0      SAB  0   IAB  0   Ectopic  0   Multiple  0   Live Births  0          Home Medications    Prior to Admission medications   Medication Sig Start Date End Date Taking? Authorizing Provider  etonogestrel (NEXPLANON) 68 MG IMPL implant 1 each (68 mg total) by Subdermal route once for 1 dose. 06/10/21 06/10/21  Conard Novak, MD    Family History Family History  Problem Relation Age of Onset   Healthy Mother    Healthy Father     Diabetes Paternal Grandmother    Diabetes Paternal Grandfather    Diabetes Paternal Uncle    Social History Social History   Tobacco Use   Smoking status: Never   Smokeless tobacco: Never  Vaping Use   Vaping status: Never Used  Substance Use Topics   Alcohol use: Never   Drug use: Never   Allergies   Crab [shellfish allergy] and Shellfish-derived products  Review of Systems Review of Systems Pertinent findings revealed after performing a 14 point review of systems has been noted in the history of present illness.  Physical Exam Vital Signs LMP 01/26/2024   No data found.  Physical Exam Vitals and nursing note reviewed.  Constitutional:      General: She is awake.     Appearance: Normal appearance. She is well-developed and well-groomed. She is ill-appearing.     Interventions: She is not intubated. HENT:     Head: Normocephalic and atraumatic.     Salivary Glands: Right salivary gland is not diffusely enlarged or tender. Left salivary gland is not diffusely enlarged or tender.     Right Ear: Hearing and external ear  normal. A middle ear effusion (Milky white) is present.     Left Ear: Hearing and external ear normal. A middle ear effusion (Milky white) is present.     Ears:     Comments: Bilateral EACs diffuse with erythema    Nose: Congestion and rhinorrhea present. Rhinorrhea is clear.     Right Sinus: No maxillary sinus tenderness or frontal sinus tenderness.     Left Sinus: No maxillary sinus tenderness.     Mouth/Throat:     Lips: Pink.     Mouth: Mucous membranes are moist.     Pharynx: No pharyngeal swelling, oropharyngeal exudate, posterior oropharyngeal erythema, uvula swelling or postnasal drip.     Tonsils: No tonsillar exudate. 0 on the right. 0 on the left.  Eyes:     General: Lids are normal.     Pupils: Pupils are equal, round, and reactive to light.  Cardiovascular:     Rate and Rhythm: Normal rate and regular rhythm.     Pulses: Normal  pulses.  Pulmonary:     Effort: Pulmonary effort is normal. No tachypnea, bradypnea, accessory muscle usage, prolonged expiration, respiratory distress or retractions. She is not intubated.     Breath sounds: Normal breath sounds and air entry. No stridor, decreased air movement or transmitted upper airway sounds. No decreased breath sounds, wheezing, rhonchi or rales.  Abdominal:     General: Abdomen is flat. Bowel sounds are normal.     Palpations: Abdomen is soft.  Musculoskeletal:        General: Normal range of motion.     Cervical back: Full passive range of motion without pain, normal range of motion and neck supple.  Lymphadenopathy:     Cervical: No cervical adenopathy.  Skin:    General: Skin is warm and dry.  Neurological:     General: No focal deficit present.     Mental Status: She is alert and oriented to person, place, and time.     Motor: Motor function is intact.     Coordination: Coordination is intact.     Gait: Gait is intact.     Deep Tendon Reflexes: Reflexes are normal and symmetric.  Psychiatric:        Attention and Perception: Attention and perception normal.        Mood and Affect: Mood and affect normal.        Speech: Speech normal.        Behavior: Behavior normal. Behavior is cooperative.        Thought Content: Thought content normal.     Visual Acuity Right Eye Distance:   Left Eye Distance:   Bilateral Distance:    Right Eye Near:   Left Eye Near:    Bilateral Near:     UC Couse / Diagnostics / Procedures:     Radiology No results found.  Procedures Procedures (including critical care time) EKG  Pending results:  Labs Reviewed  POC COVID19/FLU A&B COMBO    Medications Ordered in UC: Medications - No data to display  UC Diagnoses / Final Clinical Impressions(s)   I have reviewed the triage vital signs and the nursing notes.  Pertinent labs & imaging results that were available during my care of the patient were reviewed by  me and considered in my medical decision making (see chart for details).    Final diagnoses:  Viral URI with cough  Influenza-like illness   COVID-19, influenza and RSV test are all negative.  Patient  notified.  Patient provided with a note to return to work.  Conservative care recommended.  Supportive medications discussed.  Return precautions advised.  Please see discharge instructions below for details of plan of care as provided to patient. ED Prescriptions     Medication Sig Dispense Auth. Provider   promethazine-dextromethorphan (PROMETHAZINE-DM) 6.25-15 MG/5ML syrup Take 5 mLs by mouth at bedtime as needed for cough. 60 mL Theadora Rama Scales, PA-C   guaifenesin (HUMIBID E) 400 MG TABS tablet Take 1 tablet 3 times daily as needed for chest congestion and cough 30 tablet Theadora Rama Scales, PA-C   cetirizine (ZYRTEC ALLERGY) 10 MG tablet Take 1 tablet (10 mg total) by mouth at bedtime. 30 tablet Theadora Rama Scales, PA-C   ipratropium (ATROVENT) 0.06 % nasal spray Place 2 sprays into both nostrils 3 (three) times daily. As needed for nasal congestion, runny nose 15 mL Theadora Rama Scales, PA-C      PDMP not reviewed this encounter.  Pending results:  Labs Reviewed  POC COVID19/FLU A&B COMBO      Discharge Instructions      Your test for influenza, COVID-19 and RSV are all negative.  Please read below to learn more about the medications, dosages and frequencies that I recommend to help alleviate your symptoms and to get you feeling better soon:   Zyrtec (cetirizine): This is an excellent second-generation antihistamine that helps to reduce respiratory inflammatory response to environmental allergens.  In some patients, this medication can cause daytime sleepiness so I recommend that you take 1 tablet daily at bedtime.     Atrovent (ipratropium): This is an excellent nasal decongestant spray that does not cause rebound congestion.  Please instill 2 sprays into  each nare with each use, you may use this up to 3 times daily.  Once you find that you are forgetting to use the spray more often that you remember to use it, you will know that you no longer need it.   Advil, Motrin (ibuprofen): This is a good anti-inflammatory medication which addresses aches, pains and inflammation of the upper airways that causes sinus and nasal congestion as well as in the lower airways which makes your cough feel tight and sometimes burn.  I recommend that you take between 400 to 600 mg every 6-8 hours as needed.  Please do not take more than 2400 mg of ibuprofen in a 24-hour period and please do not take high doses of ibuprofen for more than 3 days in a row as this can lead to stomach ulcers.   Tylenol (acetaminophen): This is a good fever reducer.  If your body temperature rises above 101.5 as measured with a thermometer, it is recommended that you take 1,000 mg every 8 hours until your temperature falls below 101.5.  Please be careful when taking Tylenol (acetaminophen) along with other medications containing acetaminophen or Tylenol.  The maximum dose of Tylenol in a 24-hour period is 3000 mg, taking more than 3000 mg per day can damage your liver.         Robitussin, Mucinex (guaifenesin): This is a daytime expectorant.  This single symptom reliever helps break up chest congestion and loosen up thick nasal drainage making phlegm and drainage easier to cough up and to blow out from your nose.  I recommend taking 400 mg in either liquid or tablet form three times daily as needed.  I do not recommend the 12-hour extended relief version or doses higher than 400 mg per each dose as  these often make some patients feel jittery or jumpy and can interfere with sleep.  I also do not recommend that you purchase guaifenesin with the ingredient " DM" which is dextromethorphan, a cough suppressant which I only recommend taking at bedtime.  Guaifenesin 400 mg is a safe dose for people who are  being treated for high blood pressure.     Promethazine DM: Promethazine is both a nasal decongestant that dries up mucous membranes and an antinausea medication.  Promethazine often makes most patients feel fairly sleepy.  "DM" is dextromethorphan, a single symptom reliever which is a cough suppressant found in many over-the-counter cough medications and combination cold preparations.  Please take 5 mL before bedtime to minimize your cough which will help you sleep better.  I have sent a prescription for this medication to your pharmacy because it cannot be purchased over-the-counter.   If symptoms have not meaningfully improved in the next 5 to 7 days, please return for repeat evaluation or follow-up with your regular provider.  If symptoms have worsened in the next 3 to 5 days, please return for repeat evaluation or follow-up with your regular provider.    Thank you for visiting urgent care today.  We appreciate the opportunity to participate in your care.       Disposition Upon Discharge:  Condition: stable for discharge home  Patient presented with an acute illness with associated systemic symptoms and significant discomfort requiring urgent management. In my opinion, this is a condition that a prudent lay person (someone who possesses an average knowledge of health and medicine) may potentially expect to result in complications if not addressed urgently such as respiratory distress, impairment of bodily function or dysfunction of bodily organs.   Routine symptom specific, illness specific and/or disease specific instructions were discussed with the patient and/or caregiver at length.   As such, the patient has been evaluated and assessed, work-up was performed and treatment was provided in alignment with urgent care protocols and evidence based medicine.  Patient/parent/caregiver has been advised that the patient may require follow up for further testing and treatment if the symptoms  continue in spite of treatment, as clinically indicated and appropriate.  Patient/parent/caregiver has been advised to return to the [facility] or PCP if no better; to PCP or the Emergency Department if new signs and symptoms develop, or if the current signs or symptoms continue to change or worsen for further workup, evaluation and treatment as clinically indicated and appropriate  The patient will follow up with their current PCP if and as advised. If the patient does not currently have a PCP we will assist them in obtaining one.   The patient may need specialty follow up if the symptoms continue, in spite of conservative treatment and management, for further workup, evaluation, consultation and treatment as clinically indicated and appropriate.  Patient/parent/caregiver verbalized understanding and agreement of plan as discussed.  All questions were addressed during visit.  Please see discharge instructions below for further details of plan.  This office note has been dictated using Teaching laboratory technician.  Unfortunately, this method of dictation can sometimes lead to typographical or grammatical errors.  I apologize for your inconvenience in advance if this occurs.  Please do not hesitate to reach out to me if clarification is needed.      Theadora Rama Scales, PA-C 01/28/24 1141

## 2024-01-28 NOTE — Discharge Instructions (Signed)
Your test for influenza, COVID-19 and RSV are all negative.  Please read below to learn more about the medications, dosages and frequencies that I recommend to help alleviate your symptoms and to get you feeling better soon:   Zyrtec (cetirizine): This is an excellent second-generation antihistamine that helps to reduce respiratory inflammatory response to environmental allergens.  In some patients, this medication can cause daytime sleepiness so I recommend that you take 1 tablet daily at bedtime.     Atrovent (ipratropium): This is an excellent nasal decongestant spray that does not cause rebound congestion.  Please instill 2 sprays into each nare with each use, you may use this up to 3 times daily.  Once you find that you are forgetting to use the spray more often that you remember to use it, you will know that you no longer need it.   Advil, Motrin (ibuprofen): This is a good anti-inflammatory medication which addresses aches, pains and inflammation of the upper airways that causes sinus and nasal congestion as well as in the lower airways which makes your cough feel tight and sometimes burn.  I recommend that you take between 400 to 600 mg every 6-8 hours as needed.  Please do not take more than 2400 mg of ibuprofen in a 24-hour period and please do not take high doses of ibuprofen for more than 3 days in a row as this can lead to stomach ulcers.   Tylenol (acetaminophen): This is a good fever reducer.  If your body temperature rises above 101.5 as measured with a thermometer, it is recommended that you take 1,000 mg every 8 hours until your temperature falls below 101.5.  Please be careful when taking Tylenol (acetaminophen) along with other medications containing acetaminophen or Tylenol.  The maximum dose of Tylenol in a 24-hour period is 3000 mg, taking more than 3000 mg per day can damage your liver.         Robitussin, Mucinex (guaifenesin): This is a daytime expectorant.  This single symptom  reliever helps break up chest congestion and loosen up thick nasal drainage making phlegm and drainage easier to cough up and to blow out from your nose.  I recommend taking 400 mg in either liquid or tablet form three times daily as needed.  I do not recommend the 12-hour extended relief version or doses higher than 400 mg per each dose as these often make some patients feel jittery or jumpy and can interfere with sleep.  I also do not recommend that you purchase guaifenesin with the ingredient " DM" which is dextromethorphan, a cough suppressant which I only recommend taking at bedtime.  Guaifenesin 400 mg is a safe dose for people who are being treated for high blood pressure.     Promethazine DM: Promethazine is both a nasal decongestant that dries up mucous membranes and an antinausea medication.  Promethazine often makes most patients feel fairly sleepy.  "DM" is dextromethorphan, a single symptom reliever which is a cough suppressant found in many over-the-counter cough medications and combination cold preparations.  Please take 5 mL before bedtime to minimize your cough which will help you sleep better.  I have sent a prescription for this medication to your pharmacy because it cannot be purchased over-the-counter.   If symptoms have not meaningfully improved in the next 5 to 7 days, please return for repeat evaluation or follow-up with your regular provider.  If symptoms have worsened in the next 3 to 5 days, please return for repeat  evaluation or follow-up with your regular provider.    Thank you for visiting urgent care today.  We appreciate the opportunity to participate in your care.

## 2024-01-28 NOTE — ED Triage Notes (Signed)
Patient c/o sore throat, body aches, headache, and diarrhea x 2 days.  Patient states she has been taking Tylenol and Ibuprofen. Patient had Tylenol at 0815 today.

## 2024-03-19 ENCOUNTER — Emergency Department (HOSPITAL_COMMUNITY)
Admission: EM | Admit: 2024-03-19 | Discharge: 2024-03-19 | Disposition: A | Discharge status: Home / Self Care | Attending: Emergency Medicine | Admitting: Emergency Medicine

## 2024-03-19 ENCOUNTER — Encounter (HOSPITAL_COMMUNITY): Payer: Self-pay

## 2024-03-19 ENCOUNTER — Emergency Department (HOSPITAL_COMMUNITY)

## 2024-03-19 DIAGNOSIS — R0789 Other chest pain: Secondary | ICD-10-CM | POA: Insufficient documentation

## 2024-03-19 DIAGNOSIS — R059 Cough, unspecified: Secondary | ICD-10-CM | POA: Diagnosis not present

## 2024-03-19 DIAGNOSIS — R0602 Shortness of breath: Secondary | ICD-10-CM | POA: Diagnosis not present

## 2024-03-19 DIAGNOSIS — R197 Diarrhea, unspecified: Secondary | ICD-10-CM | POA: Diagnosis not present

## 2024-03-19 LAB — CBC
HCT: 40.6 % (ref 36.0–46.0)
Hemoglobin: 13.8 g/dL (ref 12.0–15.0)
MCH: 32.2 pg (ref 26.0–34.0)
MCHC: 34 g/dL (ref 30.0–36.0)
MCV: 94.6 fL (ref 80.0–100.0)
Platelets: 278 10*3/uL (ref 150–400)
RBC: 4.29 MIL/uL (ref 3.87–5.11)
RDW: 12.7 % (ref 11.5–15.5)
WBC: 9.2 10*3/uL (ref 4.0–10.5)
nRBC: 0 % (ref 0.0–0.2)

## 2024-03-19 LAB — RESP PANEL BY RT-PCR (RSV, FLU A&B, COVID)  RVPGX2
Influenza A by PCR: NEGATIVE
Influenza B by PCR: NEGATIVE
Resp Syncytial Virus by PCR: NEGATIVE
SARS Coronavirus 2 by RT PCR: NEGATIVE

## 2024-03-19 LAB — TROPONIN I (HIGH SENSITIVITY): Troponin I (High Sensitivity): 3 ng/L (ref <18)

## 2024-03-19 LAB — BASIC METABOLIC PANEL
Anion gap: 7 (ref 5–15)
BUN: 7 mg/dL (ref 6–20)
CO2: 27 mmol/L (ref 22–32)
Calcium: 9.1 mg/dL (ref 8.9–10.3)
Chloride: 105 mmol/L (ref 98–111)
Creatinine, Ser: 0.8 mg/dL (ref 0.44–1.00)
GFR, Estimated: >60 mL/min (ref >60)
Glucose, Bld: 89 mg/dL (ref 70–99)
Potassium: 4.1 mmol/L (ref 3.5–5.1)
Sodium: 139 mmol/L (ref 135–145)

## 2024-03-19 LAB — HEPATIC FUNCTION PANEL
ALT: 16 U/L (ref 0–44)
AST: 21 U/L (ref 15–41)
Albumin: 3.6 g/dL (ref 3.5–5.0)
Alkaline Phosphatase: 43 U/L (ref 38–126)
Bilirubin, Direct: <0.1 mg/dL (ref 0.0–0.2)
Total Bilirubin: 0.8 mg/dL (ref 0.0–1.2)
Total Protein: 6.8 g/dL (ref 6.5–8.1)

## 2024-03-19 LAB — MAGNESIUM: Magnesium: 2.1 mg/dL (ref 1.7–2.4)

## 2024-03-19 LAB — D-DIMER, QUANTITATIVE: D-Dimer, Quant: 0.27 ug{FEU}/mL (ref 0.00–0.50)

## 2024-03-19 LAB — LIPASE, BLOOD: Lipase: 35 U/L (ref 11–51)

## 2024-03-19 LAB — HCG, SERUM, QUALITATIVE: Preg, Serum: NEGATIVE

## 2024-03-19 MED ORDER — KETOROLAC TROMETHAMINE 30 MG/ML IJ SOLN
30.0000 mg | Freq: Once | INTRAMUSCULAR | Status: AC
  Administered 2024-03-19: 30 mg via INTRAMUSCULAR
  Filled 2024-03-19: qty 1

## 2024-03-19 NOTE — ED Triage Notes (Signed)
 Pt c/o L chest pain radiating into L upper back, cough, SOB, and diarrhea starting this morning.  Pain score 10/10.  Pt has not tried any interventions.

## 2024-03-19 NOTE — ED Notes (Signed)
 Phlebotomy called to draw D-dimer at this time per request of patient

## 2024-03-19 NOTE — Discharge Instructions (Addendum)
 Your workup today was reassuring  Make sure to follow-up outpatient, return for any worsening symptoms  I have written you a work note, a few days out to rest as well as reduce lifting for 2 days after that with return to full duty on 03/23/2024

## 2024-03-19 NOTE — ED Provider Notes (Signed)
 [employer] EMERGENCY DEPARTMENT AT [facility] Provider Note   CSN: [id number] Arrival date & time: 03/19/24  1508   History  Chief Complaint  Patient presents with   Chest Pain   Back Pain   Shortness of Breath   Diarrhea    Pamela Marshall is a 23 y.o. female here for evaluation of left chest wall pain.  Associated cough, shortness of breath.  Also diffuse of the diarrhea which started earlier today.  Pain worse with deep breathing, movement.  Cough productive of yellow-green sputum.  No history of PE or DVT.  No recent surgery, immobilization or malignancy.  No sick contacts.  No fever.  No meds PTA.  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    HPI     Home Medications Prior to Admission medications   Medication Sig Start Date End Date Taking? Authorizing Provider  cetirizine (ZYRTEC ALLERGY) 10 MG tablet Take 1 tablet (10 mg total) by mouth at bedtime. 01/28/24 04/27/24  Theadora Rama Scales, PA-C  guaifenesin (HUMIBID E) 400 MG TABS tablet Take 1 tablet 3 times daily as needed for chest congestion and cough 01/28/24   Theadora Rama Scales, PA-C  ipratropium (ATROVENT) 0.06 % nasal spray Place 2 sprays into both nostrils 3 (three) times daily. As needed for nasal congestion, runny nose 01/28/24   Theadora Rama Scales, PA-C  promethazine-dextromethorphan (PROMETHAZINE-DM) 6.25-15 MG/5ML syrup Take 5 mLs by mouth at bedtime as needed for cough. 01/28/24   Theadora Rama Scales, PA-C      Allergies    Crab [shellfish allergy] and Shellfish-derived products    Review of Systems   Review of Systems  Constitutional: Negative.   HENT:  Positive for congestion and sore throat (resolved, last week).   Respiratory:  Positive for cough and shortness of breath.   Cardiovascular:  Positive for chest pain. Negative for palpitations and leg swelling.  Gastrointestinal:  Positive for diarrhea. Negative for abdominal distention, abdominal pain, anal bleeding,  blood in stool, constipation, nausea and vomiting.  Genitourinary: Negative.   Musculoskeletal: Negative.   Skin: Negative.   Neurological: Negative.   All other systems reviewed and are negative.   Physical Exam Updated Vital Signs BP (!) 120/90 (BP Location: Right Wrist)   Pulse 76   Temp 98.4 F (36.9 C) (Oral)   Resp 17   Ht 5\' 2"  (1.575 m)   Wt 112 kg   SpO2 100%   BMI 45.18 kg/m  Physical Exam Vitals and nursing note reviewed.  Constitutional:      General: She is not in acute distress.    Appearance: She is well-developed. She is not ill-appearing, toxic-appearing or diaphoretic.  HENT:     Head: Atraumatic.  Eyes:     Pupils: Pupils are equal, round, and reactive to light.  Cardiovascular:     Rate and Rhythm: Normal rate.     Pulses:          Radial pulses are 2+ on the right side and 2+ on the left side.       Dorsalis pedis pulses are 2+ on the right side and 2+ on the left side.     Heart sounds: Normal heart sounds.  Pulmonary:     Effort: Pulmonary effort is normal. No respiratory distress.     Breath sounds: Normal breath sounds.  Chest:     Comments: Mild tenderness to left anterior chest wall. No crepitus Abdominal:     General:  Bowel sounds are normal. There is no distension.     Palpations: Abdomen is soft.     Comments: Soft, nontender  Musculoskeletal:        General: Normal range of motion.     Cervical back: Normal range of motion.     Right lower leg: No tenderness. No edema.     Left lower leg: No tenderness. No edema.     Comments: Full range of motion, compartments soft, nontender calves bilaterally no bony tenderness.  Skin:    General: Skin is warm and dry.     Capillary Refill: Capillary refill takes less than 2 seconds.  Neurological:     General: No focal deficit present.     Mental Status: She is alert.  Psychiatric:        Mood and Affect: Mood normal.     ED Results / Procedures / Treatments   Labs (all labs ordered are  listed, but only abnormal results are displayed) Labs Reviewed  RESP PANEL BY RT-PCR (RSV, FLU A&B, COVID)  [id number]  BASIC METABOLIC PANEL  CBC  HCG, SERUM, QUALITATIVE  LIPASE, BLOOD  HEPATIC FUNCTION PANEL  MAGNESIUM  D-DIMER, QUANTITATIVE (NOT AT [facility])  TROPONIN I (HIGH SENSITIVITY)    EKG None  Radiology DG Chest 2 View Result Date: 03/19/2024 CLINICAL DATA:  Left chest pain rating to the left back. Cough, shortness of breath and diarrhea. EXAM: CHEST - 2 VIEW COMPARISON:  None Available. FINDINGS: Trachea is midline. Heart size normal. Lungs are clear. No pleural fluid. IMPRESSION: Negative. Electronically Signed   By: Leanna Battles M.D.   On: 03/19/2024 16:20    Procedures Procedures    Medications Ordered in ED Medications  ketorolac (TORADOL) 30 MG/ML injection 30 mg (30 mg Intramuscular Given 03/19/24 1921)    ED Course/ Medical Decision Making/ A&P  23 year old here for evaluation of left anterior chest wall pain associated cough, shortness of breath.  Feel this is a loose stool earlier today.  No blood in stool.  No chronic NSAID use, EtOH use.  No melena, blood per rectum.  Cough is productive of yellow sputum.  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  On arrival she is afebrile, nonseptic, non-ill-appearing.  She has no risk factors for PE or DVT.  No clinical evidence of VTE on exam.  Her pain is reproducible on exam.  Is pleuritic in nature as well as worse with movement.  Will plan on labs, imaging and reassess  Labs and imaging personally viewed and interpreted:  COVID, flu, RSV negative CBC without leukocytosis Metabolic panel without significant abnormality Troponin negative Lipase 35 Pregnancy test negative D-dimer 0.27 Chest x-ray without cardiomegaly, pulm edema, pneumothorax, infiltrates EKG without ischemic changes  Patient reassessed.  Discussed labs and imaging.  Chest pain reproducible on exam.  At this time low sufficient for  acute ACS, PE, dissection, bacterial infectious process, [place], pneumothorax, pericarditis, carditis, endocarditis, acute intra-abdominal etiology such as perforated viscus  The patient has been appropriately medically screened and/or stabilized in the ED. I have low suspicion for any other emergent medical condition which would require further screening, evaluation or treatment in the ED or require inpatient management.  Patient is hemodynamically stable and in no acute distress.  Patient able to ambulate in department prior to ED.  Evaluation does not show acute pathology that would require ongoing or additional emergent interventions while in the emergency department or further inpatient treatment.  I have discussed the diagnosis  with the patient and answered all questions.  Pain is been managed while in the emergency department and patient has no further complaints prior to discharge.  Patient is comfortable with plan discussed in room and is stable for discharge at this time.  I have discussed strict return precautions for returning to the emergency department.  Patient was encouraged to follow-up with PCP/specialist refer to at discharge.                                  Medical Decision Making Amount and/or Complexity of Data Reviewed External Data Reviewed: labs, radiology, ECG and notes. Labs: ordered. Decision-making details documented in ED Course. Radiology: ordered and independent interpretation performed. Decision-making details documented in ED Course. ECG/medicine tests: ordered and independent interpretation performed. Decision-making details documented in ED Course.  Risk OTC drugs. Prescription drug management. Parenteral controlled substances. Decision regarding hospitalization. Diagnosis or treatment significantly limited by social determinants of health.          Final Clinical Impression(s) / ED Diagnoses Final diagnoses:  Chest wall pain    Rx / DC  Orders ED Discharge Orders     None         Athony Coppa A, PA-C 03/19/24 2331    Glyn Ade, MD 03/20/24 1456

## 2024-03-22 ENCOUNTER — Telehealth: Payer: Self-pay | Admitting: *Deleted

## 2024-03-22 NOTE — Telephone Encounter (Signed)
 Pt called regarding work excuse having conflicting information.  RNCM updated work excuse and left copy at front dest to be picked up by pt.

## 2024-06-14 ENCOUNTER — Ambulatory Visit (HOSPITAL_COMMUNITY)
Admission: EM | Admit: 2024-06-14 | Discharge: 2024-06-14 | Disposition: A | Discharge status: Home / Self Care | Attending: Emergency Medicine | Admitting: Emergency Medicine

## 2024-06-14 ENCOUNTER — Encounter (HOSPITAL_COMMUNITY): Payer: Self-pay

## 2024-06-14 DIAGNOSIS — M25551 Pain in right hip: Secondary | ICD-10-CM

## 2024-06-14 DIAGNOSIS — B349 Viral infection, unspecified: Secondary | ICD-10-CM | POA: Diagnosis not present

## 2024-06-14 DIAGNOSIS — M5431 Sciatica, right side: Secondary | ICD-10-CM

## 2024-06-14 DIAGNOSIS — K5900 Constipation, unspecified: Secondary | ICD-10-CM

## 2024-06-14 DIAGNOSIS — G8929 Other chronic pain: Secondary | ICD-10-CM

## 2024-06-14 DIAGNOSIS — R112 Nausea with vomiting, unspecified: Secondary | ICD-10-CM

## 2024-06-14 LAB — POC COVID19/FLU A&B COMBO
Covid Antigen, POC: NEGATIVE
Influenza A Antigen, POC: NEGATIVE
Influenza B Antigen, POC: NEGATIVE

## 2024-06-14 MED ORDER — DEXAMETHASONE SODIUM PHOSPHATE 10 MG/ML IJ SOLN
10.0000 mg | Freq: Once | INTRAMUSCULAR | Status: AC
  Administered 2024-06-14: 10 mg via INTRAMUSCULAR

## 2024-06-14 MED ORDER — NAPROXEN 500 MG PO TABS: 500.0000 mg | ORAL_TABLET | Freq: Two times a day (BID) | ORAL | 0 refills | Status: DC

## 2024-06-14 MED ORDER — ONDANSETRON 4 MG PO TBDP: 4.0000 mg | ORAL_TABLET | Freq: Three times a day (TID) | ORAL | 0 refills | Status: AC | PRN

## 2024-06-14 MED ORDER — DEXAMETHASONE SODIUM PHOSPHATE 10 MG/ML IJ SOLN
INTRAMUSCULAR | Status: AC
  Filled 2024-06-14: qty 1

## 2024-06-14 MED ORDER — POLYETHYLENE GLYCOL 3350 17 G PO PACK: 17.0000 g | PACK | Freq: Every day | ORAL | 0 refills | Status: AC
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
## 2024-06-14 NOTE — Discharge Instructions (Signed)
 You were given an injection of Decadron in clinic today to help with inflammation likely causing your sciatic nerve pain and contributing to your hip and leg pain. Take naproxen twice daily as needed for hip pain.  Do not take this with other NSAIDs including ibuprofen , Advil , Motrin , and Aleve. Otherwise you can take 650 mg of Tylenol  every 6-8 hours as needed for breakthrough pain. You can follow-up with [facility] if your hip pain continues.  I believe your symptoms are likely related to a viral illness.   Take Zofran  every 8 hours as needed for nausea and vomiting.  This will dissolve under your tongue. As discussed this can contribute to worsening constipation. I have prescribed MiraLAX that you can take once daily to help soften your stools and help with constipation. Otherwise make sure you are eating foods high in fiber including fruits, vegetables, and whole grain foods.  Make sure you are staying well-hydrated. Follow-up with your primary care provider or return here as needed. If you develop severe abdominal pain, excessive vomiting, excessive blood in your vomit or stool, or fevers unrelieved by medication please seek immediate medical treatment in the emergency department.

## 2024-06-14 NOTE — ED Triage Notes (Signed)
 Pt states vomiting, headache and body aches. 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  Pt also having  right hip pain that radiated down her right leg for the past 2 weeks.

## 2024-09-24 ENCOUNTER — Ambulatory Visit (INDEPENDENT_AMBULATORY_CARE_PROVIDER_SITE_OTHER)

## 2024-09-24 ENCOUNTER — Ambulatory Visit (HOSPITAL_COMMUNITY)
Admission: EM | Admit: 2024-09-24 | Discharge: 2024-09-24 | Disposition: A | Discharge status: Home / Self Care | Attending: Emergency Medicine | Admitting: Emergency Medicine

## 2024-09-24 ENCOUNTER — Encounter (HOSPITAL_COMMUNITY): Payer: Self-pay | Admitting: Emergency Medicine

## 2024-09-24 DIAGNOSIS — S92035A Nondisplaced avulsion fracture of tuberosity of left calcaneus, initial encounter for closed fracture: Secondary | ICD-10-CM | POA: Diagnosis not present

## 2024-09-24 DIAGNOSIS — S99912A Unspecified injury of left ankle, initial encounter: Secondary | ICD-10-CM

## 2024-09-24 MED ORDER — KETOROLAC TROMETHAMINE 30 MG/ML IJ SOLN
30.0000 mg | Freq: Once | INTRAMUSCULAR | Status: AC
  Administered 2024-09-24: 30 mg via INTRAMUSCULAR

## 2024-09-24 MED ORDER — KETOROLAC TROMETHAMINE 30 MG/ML IJ SOLN
INTRAMUSCULAR | Status: AC
  Filled 2024-09-24: qty 1

## 2024-09-24 MED ORDER — IBUPROFEN 800 MG PO TABS: 800.0000 mg | ORAL_TABLET | Freq: Three times a day (TID) | ORAL | 0 refills | Status: DC

## 2024-09-24 NOTE — Discharge Instructions (Addendum)
 It is appear that you have a fracture to one of the bones that makes up your foot. We have placed you in a boot and provided you with crutches today. You were given an injection of Toradol  in clinic today to help with your pain. Do not take any ibuprofen  until 8 hours after receiving this. After this you can take 800 mg ibuprofen  every 8 hours as needed for pain. You can alternate this with 500 to 1000 mg of Tylenol  every 6-8 hours as needed for breakthrough pain.  Do not exceed 4000 mg in 1 day. Follow-up with [facility] for further evaluation and management of this injury.

## 2024-09-24 NOTE — ED Triage Notes (Signed)
 Pt reports was going down the stairs today and stepped down wrong and having left ankle pain. States can't bear weight.

## 2024-09-25 NOTE — ED Provider Notes (Signed)
 [facility]    CSN: [id number] Arrival date & time: 09/24/24  1642      History   Chief Complaint Chief Complaint  Patient presents with   Ankle Pain    HPI Pamela Marshall is a 23 y.o. female.   Patient presents with left ankle pain after she was walking down the stairs and stepped wrong and twisted her ankle.  Patient states that she can not bear weight on her foot and ankle due to the pain.  Patient denies any other injuries from this incident.  Patient denies taking anything for pain.  The history is provided by the patient and medical records.  Ankle Pain   Past Medical History:  Diagnosis Date   Asthma    Athlete's foot    Hypertension     Patient Active Problem List   Diagnosis Date Noted   Gonorrhea 09/09/2021   Chlamydia 09/09/2021    Past Surgical History:  Procedure Laterality Date   CYST REMOVAL HAND     TONSILLECTOMY AND ADENOIDECTOMY      OB History     Gravida  0   Para  0   Term  0   Preterm  0   AB  0   Living  0      SAB  0   IAB  0   Ectopic  0   Multiple  0   Live Births  0            Home Medications    Prior to Admission medications   Medication Sig Start Date End Date Taking? Authorizing Provider  ibuprofen  (ADVIL ) 800 MG tablet Take 1 tablet (800 mg total) by mouth 3 (three) times daily. 09/24/24  Yes Johnie, Denora Wysocki A, NP  cetirizine  (ZYRTEC  ALLERGY) 10 MG tablet Take 1 tablet (10 mg total) by mouth at bedtime. 01/28/24 04/27/24  Joesph Shaver Scales, PA-C  guaifenesin  (HUMIBID E) 400 MG TABS tablet Take 1 tablet 3 times daily as needed for chest congestion and cough 01/28/24   Joesph Shaver Scales, PA-C  ipratropium (ATROVENT ) 0.06 % nasal spray Place 2 sprays into both nostrils 3 (three) times daily. As needed for nasal congestion, runny nose 01/28/24   Joesph Shaver Scales, PA-C  ondansetron  (ZOFRAN -ODT) 4 MG disintegrating tablet Take 1 tablet (4 mg total) by mouth every 8 (eight) hours as  needed for nausea or vomiting. 06/14/24   Johnie Flaming A, NP  polyethylene glycol (MIRALAX ) 17 g packet Take 17 g by mouth daily. 06/14/24   Johnie Flaming LABOR, NP  promethazine -dextromethorphan (PROMETHAZINE -DM) 6.25-15 MG/5ML syrup Take 5 mLs by mouth at bedtime as needed for cough. 01/28/24   Joesph Shaver Scales, PA-C    Family History Family History  Problem Relation Age of Onset   Healthy Mother    Healthy Father    Diabetes Paternal Grandmother    Diabetes Paternal Grandfather    Diabetes Paternal Uncle     Social History Social History   Tobacco Use   Smoking status: Former    Types: Cigarettes    Passive exposure: Never   Smokeless tobacco: Never  Vaping Use   Vaping status: Never Used  Substance Use Topics   Alcohol use: Never   Drug use: Never     Allergies   Crab [shellfish allergy] and Shellfish-derived products   Review of Systems Review of Systems  Per HPI  Physical Exam Triage Vital Signs ED Triage Vitals  Encounter Vitals Group  BP 09/24/24 1725 117/73     Girls Systolic BP Percentile --      Girls Diastolic BP Percentile --      Boys Systolic BP Percentile --      Boys Diastolic BP Percentile --      Pulse Rate 09/24/24 1725 94     Resp 09/24/24 1725 18     Temp 09/24/24 1725 98.1 F (36.7 C)     Temp Source 09/24/24 1725 Oral     SpO2 09/24/24 1725 96 %     Weight --      Height --      Head Circumference --      Peak Flow --      Pain Score 09/24/24 1723 10     Pain Loc --      Pain Education --      Exclude from Growth Chart --    No data found.  Updated Vital Signs BP 117/73 (BP Location: Right Arm)   Pulse 94   Temp 98.1 F (36.7 C) (Oral)   Resp 18   LMP 09/02/2024 (Exact Date)   SpO2 96%   Visual Acuity Right Eye Distance:   Left Eye Distance:   Bilateral Distance:    Right Eye Near:   Left Eye Near:    Bilateral Near:     Physical Exam Vitals and nursing note reviewed.  Constitutional:      General:  She is awake. She is not in acute distress.    Appearance: Normal appearance. She is well-developed and well-groomed. She is not ill-appearing.  Musculoskeletal:     Left ankle: Swelling present. No deformity. Tenderness present over the medial malleolus. Decreased range of motion.  Skin:    General: Skin is warm and dry.  Neurological:     Mental Status: She is alert.  Psychiatric:        Behavior: Behavior is cooperative.      UC Treatments / Results  Labs (all labs ordered are listed, but only abnormal results are displayed) Labs Reviewed - No data to display  EKG   Radiology DG Ankle Complete Left Result Date: 09/24/2024 CLINICAL DATA:  Clemens downstairs, pain, inability to bear weight EXAM: LEFT ANKLE COMPLETE - 3+ VIEW COMPARISON:  09/09/2022 FINDINGS: Frontal, oblique, lateral views of the left ankle are obtained. No acute displaced fracture, subluxation, or dislocation. There is asymmetric widening along the lateral aspect of the tibiotalar joint, which may reflect ligamentous injury or laxity. This is new since the prior x-ray. Mild osteoarthritis of the tibiotalar joint, hindfoot, and midfoot. 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 There is diffuse soft tissue swelling, greatest anteriorly. IMPRESSION: 1. No acute displaced fracture. 2. Mild asymmetric widening of the lateral aspect of the tibiotalar joint, which could reflect ligamentous injury or laxity. 3. 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 Electronically Signed   By: Ozell Daring M.D.   On: 09/24/2024 18:07    Procedures Procedures (including critical care time)  Medications Ordered in UC Medications  ketorolac  (TORADOL ) 30 MG/ML injection 30 mg (30 mg Intramuscular Given 09/24/24 1812)    Initial Impression / Assessment and Plan / UC Course  I have reviewed the triage  vital signs and the nursing notes.  Pertinent labs & imaging results that were available during my care of the patient were reviewed by me and considered in my medical decision  making (see chart for details).     Patient is overall well-appearing.  Vitals are stable.  X-ray ordered.  Patient my interpretation there appears to be an avulsion fracture of the calcaneus.  Radiology report does not suggest this, but does report mild asymmetric widening of the lateral aspect of the tibiotalar joint which could reflect ligamentous injury or laxity.  Placed patient in cam boot and provided crutches to assist with walking.  Given IM Toradol  in clinic for acute pain.  Prescribed ibuprofen  as needed for pain.  Given orthopedic follow-up.  Discussed follow-up and return precautions. Final Clinical Impressions(s) / UC Diagnoses   Final diagnoses:  Closed nondisplaced avulsion fracture of tuberosity of left calcaneus, initial encounter  Injury of left ankle, initial encounter     Discharge Instructions      It is appear that you have a fracture to one of the bones that makes up your foot. We have placed you in a boot and provided you with crutches today. You were given an injection of Toradol  in clinic today to help with your pain. Do not take any ibuprofen  until 8 hours after receiving this. After this you can take 800 mg ibuprofen  every 8 hours as needed for pain. You can alternate this with 500 to 1000 mg of Tylenol  every 6-8 hours as needed for breakthrough pain.  Do not exceed 4000 mg in 1 day. Follow-up with [place] [facility] for further evaluation and management of this injury.    ED Prescriptions     Medication Sig Dispense Auth. Provider   ibuprofen  (ADVIL ) 800 MG tablet Take 1 tablet (800 mg total) by mouth 3 (three) times daily. 21 tablet Johnie Flaming A, NP      PDMP not reviewed this encounter.   Johnie Flaming A, NP 09/25/24 1050

## 2024-09-26 ENCOUNTER — Ambulatory Visit: Admitting: Family Medicine

## 2024-09-27 ENCOUNTER — Ambulatory Visit: Admitting: Family Medicine

## 2024-09-27 ENCOUNTER — Ambulatory Visit
Admission: RE | Admit: 2024-09-27 | Discharge: 2024-09-27 | Disposition: A | Source: Ambulatory Visit | Attending: Family Medicine | Admitting: Family Medicine

## 2024-09-27 ENCOUNTER — Ambulatory Visit: Payer: Self-pay | Admitting: Family Medicine

## 2024-09-27 ENCOUNTER — Encounter: Payer: Self-pay | Admitting: Family Medicine

## 2024-09-27 VITALS — BP 128/74 | Ht 62.0 in | Wt 238.0 lb

## 2024-09-27 DIAGNOSIS — S92002A Unspecified fracture of left calcaneus, initial encounter for closed fracture: Secondary | ICD-10-CM

## 2024-09-27 DIAGNOSIS — M25572 Pain in left ankle and joints of left foot: Secondary | ICD-10-CM

## 2024-09-27 DIAGNOSIS — M899 Disorder of bone, unspecified: Secondary | ICD-10-CM

## 2024-09-27 MED ORDER — DICLOFENAC SODIUM 75 MG PO TBEC: 75.0000 mg | DELAYED_RELEASE_TABLET | Freq: Two times a day (BID) | ORAL | 1 refills | Status: AC

## 2024-09-27 NOTE — Progress Notes (Signed)
 X-rays reviewed including left ankle and left tibia/fibula.  MyChart message sent

## 2024-09-27 NOTE — Patient Instructions (Signed)
 I am concerned that you have possibly fractured your ankle or a bone in your ankle called your calcaneus - I placed an order for you to get an updated ankle xray, as well as a leg (tibia/fibula) xray - I placed an order for a STAT MRI Lt ankle to evaluate for fracture vs ligament injury - You should continue to use the boot and crutches.  Try to be non-weight bearing on the left foot/ankle if possible - You should stop taking your Ibuprofen .  We sent a new prescription for Diclofenac  75mg  1 tab by mouth twice daily with food - I gave you a note to keep you out of work for the next 2 weeks - you will follow-up with me after the MRI  Do not hesitate to reach out with any questions.
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
## 2024-10-09 ENCOUNTER — Other Ambulatory Visit

## 2024-10-10 ENCOUNTER — Telehealth: Payer: Self-pay

## 2024-10-10 ENCOUNTER — Ambulatory Visit
Admission: RE | Admit: 2024-10-10 | Discharge: 2024-10-10 | Disposition: A | Discharge status: Home / Self Care | Source: Ambulatory Visit | Attending: Family Medicine | Admitting: Family Medicine

## 2024-10-10 DIAGNOSIS — S92002A Unspecified fracture of left calcaneus, initial encounter for closed fracture: Secondary | ICD-10-CM

## 2024-10-10 DIAGNOSIS — M25572 Pain in left ankle and joints of left foot: Secondary | ICD-10-CM

## 2024-10-10 NOTE — Progress Notes (Signed)
 MRI reviewed.  Spoke to patient on the phone 5:35 PM EST on 10/10/2024.  MRI results reviewed.  Recommend evaluation with Ortho surgery/foot and ankle.  Recommend referral to Dr. Medford Pae with [facility].  Will have staff place referral tomorrow morning.  Patient should continue with cam boot as she is doing.  Will follow-up with orthopedist in the future.  Will cancel upcoming appointment with me later this week.  She already has a work note to excuse her for the remainder of the week.  She will reach out if she needs any further extension prior to seeing orthopedist.  -------------------------- Megan-please place referral to Dr. 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  Please eval and treat.  Previously seen by [facility] in [place] per patient.  Thanks!

## 2024-10-10 NOTE — Telephone Encounter (Signed)
 Patient called office to inform that she has an MRI scheduled on today at 11:20, and that she was supposed to return to work today, patient would like to know if she could get an extension to return to work later in the week, please advise, thanks.

## 2024-10-10 NOTE — Telephone Encounter (Signed)
 Spoke with patient, informed her of letter, and patient scheduled Thursday morning to review MRI, thanks.

## 2024-10-11 ENCOUNTER — Other Ambulatory Visit: Payer: Self-pay

## 2024-10-11 DIAGNOSIS — S92002A Unspecified fracture of left calcaneus, initial encounter for closed fracture: Secondary | ICD-10-CM

## 2024-10-11 DIAGNOSIS — M25572 Pain in left ankle and joints of left foot: Secondary | ICD-10-CM

## 2024-10-11 DIAGNOSIS — M899 Disorder of bone, unspecified: Secondary | ICD-10-CM

## 2024-10-13 ENCOUNTER — Ambulatory Visit: Admitting: Family Medicine
